# Patient Record
Sex: Female | Born: 1970 | Race: White | Hispanic: No | Marital: Married | Smoking: Current every day smoker
Health system: Southern US, Community
[De-identification: ages and names within clinical notes are randomized; demographics above are authoritative.]

## PROBLEM LIST (undated history)

## (undated) DIAGNOSIS — I10 Essential (primary) hypertension: Secondary | ICD-10-CM

## (undated) DIAGNOSIS — E119 Type 2 diabetes mellitus without complications: Secondary | ICD-10-CM

## (undated) HISTORY — PX: ABDOMINAL HYSTERECTOMY: SHX81

## (undated) HISTORY — PX: REPLACEMENT TOTAL KNEE BILATERAL: SUR1225

---

## 2003-12-10 ENCOUNTER — Ambulatory Visit (HOSPITAL_COMMUNITY): Admission: RE | Admit: 2003-12-10 | Discharge: 2003-12-10 | Payer: Self-pay | Admitting: Internal Medicine

## 2004-05-24 ENCOUNTER — Inpatient Hospital Stay (HOSPITAL_COMMUNITY): Admission: RE | Admit: 2004-05-24 | Discharge: 2004-05-28 | Payer: Self-pay | Admitting: Orthopedic Surgery

## 2005-07-04 ENCOUNTER — Ambulatory Visit (HOSPITAL_BASED_OUTPATIENT_CLINIC_OR_DEPARTMENT_OTHER): Admission: RE | Admit: 2005-07-04 | Discharge: 2005-07-04 | Payer: Self-pay | Admitting: Orthopedic Surgery

## 2005-11-04 ENCOUNTER — Encounter: Admission: RE | Admit: 2005-11-04 | Discharge: 2005-11-04 | Payer: Self-pay | Admitting: Orthopedic Surgery

## 2005-12-15 ENCOUNTER — Encounter (INDEPENDENT_AMBULATORY_CARE_PROVIDER_SITE_OTHER): Payer: Self-pay | Admitting: Specialist

## 2005-12-15 ENCOUNTER — Inpatient Hospital Stay (HOSPITAL_COMMUNITY): Admission: RE | Admit: 2005-12-15 | Discharge: 2005-12-19 | Payer: Self-pay | Admitting: Orthopedic Surgery

## 2006-02-14 ENCOUNTER — Encounter: Admission: RE | Admit: 2006-02-14 | Discharge: 2006-02-14 | Payer: Self-pay | Admitting: Orthopedic Surgery

## 2006-05-30 ENCOUNTER — Encounter: Admission: RE | Admit: 2006-05-30 | Discharge: 2006-05-30 | Payer: Self-pay | Admitting: Orthopedic Surgery

## 2006-06-08 ENCOUNTER — Encounter: Admission: RE | Admit: 2006-06-08 | Discharge: 2006-06-08 | Payer: Self-pay | Admitting: Orthopedic Surgery

## 2007-07-30 ENCOUNTER — Ambulatory Visit (HOSPITAL_BASED_OUTPATIENT_CLINIC_OR_DEPARTMENT_OTHER): Admission: RE | Admit: 2007-07-30 | Discharge: 2007-07-30 | Payer: Self-pay | Admitting: Anesthesiology

## 2007-08-11 ENCOUNTER — Ambulatory Visit: Payer: Self-pay | Admitting: Internal Medicine

## 2008-03-27 ENCOUNTER — Ambulatory Visit (HOSPITAL_COMMUNITY): Admission: RE | Admit: 2008-03-27 | Discharge: 2008-03-27 | Payer: Self-pay | Admitting: Orthopedic Surgery

## 2008-08-25 ENCOUNTER — Inpatient Hospital Stay (HOSPITAL_COMMUNITY): Admission: RE | Admit: 2008-08-25 | Discharge: 2008-08-28 | Payer: Self-pay | Admitting: Orthopedic Surgery

## 2010-04-04 ENCOUNTER — Encounter: Payer: Self-pay | Admitting: Internal Medicine

## 2010-04-04 ENCOUNTER — Encounter: Payer: Self-pay | Admitting: Orthopedic Surgery

## 2010-06-21 LAB — URINE MICROSCOPIC-ADD ON

## 2010-06-21 LAB — CBC
HCT: 25.5 % — ABNORMAL LOW (ref 36.0–46.0)
HCT: 37.5 % (ref 36.0–46.0)
MCHC: 34.3 g/dL (ref 30.0–36.0)
MCV: 88.9 fL (ref 78.0–100.0)
MCV: 88.6 fL (ref 78.0–100.0)
Platelets: 209 10*3/uL (ref 150–400)
Platelets: 260 10*3/uL (ref 150–400)
RBC: 2.87 MIL/uL — ABNORMAL LOW (ref 3.87–5.11)
RBC: 4.23 MIL/uL (ref 3.87–5.11)
RDW: 13.1 % (ref 11.5–15.5)
WBC: 8.4 10*3/uL (ref 4.0–10.5)
WBC: 5.6 10*3/uL (ref 4.0–10.5)
WBC: 9.3 10*3/uL (ref 4.0–10.5)

## 2010-06-21 LAB — URINALYSIS, ROUTINE W REFLEX MICROSCOPIC
Bilirubin Urine: NEGATIVE
Glucose, UA: NEGATIVE mg/dL
Hgb urine dipstick: NEGATIVE
Specific Gravity, Urine: 1.031 — ABNORMAL HIGH (ref 1.005–1.030)
pH: 6 (ref 5.0–8.0)

## 2010-06-21 LAB — DIFFERENTIAL
Basophils Relative: 0 % (ref 0–1)
Eosinophils Absolute: 0.2 10*3/uL (ref 0.0–0.7)
Eosinophils Relative: 3 % (ref 0–5)
Lymphs Abs: 2.3 10*3/uL (ref 0.7–4.0)
Monocytes Relative: 3 % (ref 3–12)
Neutrophils Relative %: 51 % (ref 43–77)

## 2010-06-21 LAB — BASIC METABOLIC PANEL
BUN: 4 mg/dL — ABNORMAL LOW (ref 6–23)
BUN: 7 mg/dL (ref 6–23)
BUN: 8 mg/dL (ref 6–23)
CO2: 31 mEq/L (ref 19–32)
CO2: 28 mEq/L (ref 19–32)
CO2: 33 mEq/L — ABNORMAL HIGH (ref 19–32)
Calcium: 8.4 mg/dL (ref 8.4–10.5)
Calcium: 8.3 mg/dL — ABNORMAL LOW (ref 8.4–10.5)
Chloride: 104 mEq/L (ref 96–112)
Creatinine, Ser: 0.64 mg/dL (ref 0.4–1.2)
Creatinine, Ser: 0.62 mg/dL (ref 0.4–1.2)
Creatinine, Ser: 0.64 mg/dL (ref 0.4–1.2)
GFR calc non Af Amer: >60 mL/min (ref >60)
Glucose, Bld: 129 mg/dL — ABNORMAL HIGH (ref 70–99)
Glucose, Bld: 132 mg/dL — ABNORMAL HIGH (ref 70–99)
Potassium: 4.6 mEq/L (ref 3.5–5.1)
Sodium: 136 mEq/L (ref 135–145)

## 2010-06-21 LAB — TYPE AND SCREEN
ABO/RH(D): O POS
Antibody Screen: NEGATIVE

## 2010-07-27 NOTE — Op Note (Signed)
NAME:  [name], [facility]            ACCOUNT NO.:  [id number]   MEDICAL RECORD NO.:  [id number]          PATIENT TYPE:  INP   LOCATION:  0009                         FACILITY:  [facility]   PHYSICIAN:  Madlyn Frankel. Charlann Boxer, M.D.  DATE OF BIRTH:  07-Apr-1970   DATE OF PROCEDURE:  08/25/2008  DATE OF DISCHARGE:                               OPERATIVE REPORT   PREOPERATIVE DIAGNOSIS:  Failed right total knee replacement.   POSTOPERATIVE DIAGNOSIS:  Failed right total knee replacement.   PROCEDURE:  Revision right total knee replacement utilizing DePuy  components, a size 3 posterior stabilized femoral component with a 4-mm  distal lateral augment with a standard stabilized box cut, a size 4 MBT  revision tray with 13-mm x 30-mm stem, 5-mm medial and lateral augments  and a 12.5 posterior stabilized insert with a 38 patella button.   SURGEON:  Madlyn Frankel. Charlann Boxer, MD.   ASSISTANT:  Dwyane Luo, PA.   ANESTHESIA:  General plus preoperative femoral nerve block.   SPECIMENS:  None.   FINDINGS:  Joint fluid was clear upon entry in the knee and the tibial  component was obviously loose with very little effort to remove despite  stem component.   BLOOD LOSS:  300 mL.   DRAINS:  Times 1.   TOURNIQUET TIME:  110 minutes at 300 mmHg.   COMPLICATIONS:  None.   INDICATIONS OF THE PROCEDURE:  Ms. [name] is a 40 year old female with  a history of index right total knee replacement.  She had stem  components placed initially.  She developed the onset of some leg pain  which was worked up for infection and found be negative.  A bone scan  indicated loosening of tibial component.  Given the pain and decreased  quality of life, she wished to proceed with a revision surgery despite  her age and discussions regarding this.  Consent was obtained for  revision right knee.   PROCEDURE IN DETAIL:  The patient was brought to the operative theater.  Once adequate anesthesia and preoperative antibiotics, 2 grams  of Ancef  administered the patient was positioned supine.  A right thigh  tourniquet was placed.  The leg was then pre-scrubbed, prepped and  draped in a sterile fashion.  A time-out was performed.  The right foot  was then placed into the [facility] leg holder.  The leg was exsanguinated,  tourniquet elevated to 300 mmHg.  The patient's old incision was  excised.  Sharp dissection was carried down to the extensor mechanism.  A median arthrotomy was carried out.  Time was spent at this point to  expose the suprapatellar pouch, medial and lateral gutters.  The patella  was able to be subluxated out of the way.  Following a proximal medial  peel off the proximal tibia, I was able to expose the proximal tibia and  debrided some of the anterior bone that had overgrown across this area  or was due to subsidence, the tibial component noted be loose.   I was able to free it up but had not yet fully exposed the  posterolateral corner.  I switched over and worked on the femur a bit, using osteotomes to free  up underneath the undersurface of the cement and component interface.  I  then used a Gigli saw in order to further free up some of the bone on  the anterior aspect of the femur.   Following this work on the femur, I attended back to the tibial  component, the tibia was now able to be subluxated and more anterior  with the femoral component in place.  I was able to move the tibial  component without difficulty.   Prior to removing any of the cement, the femoral component was removed  using osteotomes.  There was very little bone loss.  The ends of the  bone were debrided of any cement.   This point in time was spent removing cement from the femoral side as  well as the tibial side.  There was a non-cement bony type pedestal on  the distal portion of the tibia which was penetrated using an osteotome  and then opened up with a canal finder.   Femoral preparation was carried out first.  I  checked to make sure there  was no further cuts that were necessary on the distal cut, an  intramedullary rod was placed in 5 degrees valgus.  I ended up removing  very little, if any, bone on the distal medial side and very little to  any bone with a 4-mm cut on the lateral side.  For this reason, we chose  a 4-mm augment.   When I used the intramedullary guide and even with a +2 bolt it still  seemed anteriorize my femoral component significantly.  For this reason,  a lot of the cuts at this point were done utilizing a size 3 trial  component.  Once I identified that this was going to be sitting flush  anteriorly, I only removed a little bit of bone on the posteromedial  aspect the femur.   At this point we went ahead and did the box cut for the size 3  component, basing it on the lateral aspect of the distal femur, a box  cut was made for a standard posterior stabilized insert, chamfer cuts  made.  This also confirmed the posterior cut.   With the femoral component now finished, please note that the rotation  was based off the proximal cut of the tibia which was perpendicular as  checked with an extramedullary guide.  The extramedullary guide was  placed on the proximal tibia and a little bit of bone was removed on the  posterior aspect of the tibia.  I then chose to use a size 4 tibial  component which fit the cut surface best.   Time was spent at this point to confirm that I going to have a tibial  component with sitting in neutral within the canal on the medial and  lateral planes without any varus or valgus position.  A saw blade was  used to shave down the lateral side of the tibia a little bit in order  to assure that there was no varus positioning.   Eventually, I did a trial reduction with a 3 femur, 4 tibial tray with  MBT revision tray in place and a 15 insert, the knee came in a little  bit of hyperextension.  After this was done, I made a decision to go  ahead and  broach for a 29 cemented sleeve and used 5-mm medial and  lateral augments.   The final components were opened on the field, final tibial preparation  was carried out.   With trial components in place, I revised the patella as well, removing  the old patellar button which was an inset button.   I was able to debride the lateral facet changes.  I debrided and sawed  down to a size 14-mm and then used a 38 patellar button.  Drill holes  were made.  The patella tracked through the trochlea without application  of pressure.   At this point, the trial components were removed.  Final components were  opened on the back table.  The knee was irrigated with normal saline  solution, including using the canal brush on the tibia.  A cement  restrictor was placed distal on the tibia.  We mixed 3 bags of cement  with 1 gram of vancomycin and used a cement gun.  Once the cement ready,  the cement was cemented into the tibia.  The final tibial component,  which was a size 4 MBT revision tray with a 13 x 30 cemented sleeve, a  29-mm anterior placed sleeve and 5-mm medial and lateral augments was  impacted and excessive cement removed.  The femoral component was then  cemented in position with a distal 4-mm lateral augment.   The knee was then brought out to extension with a 12.5 insert, the knee  came to full extension, was held until cement fully cured.  Once the  cement had cured, excessive cement was removed.  The patella had been  cemented in position as well.  Based on examination, the ligaments were  very nice and taut from extension and flexion with a 12.5 insert, this  was chosen as the final insert.  The 12.5 posterior stabilized insert  was then placed.  We irrigated the knee throughout the case again at  this point, finishing up a 3 liter bag of normal saline solution.  A  medium Hemovac drain was placed, the tourniquet was let down after 110  minutes.  The extensor mechanism was then  reapproximated using #1 Vicryl  with the knee in flexion.  The remainder of the wound was closed with 2-  0 Vicryl running and staples on the skin.  Skin was cleaned, dried and  dressed sterilely with a sterile bulky dressing.  She was brought to the  recovery room in stable condition tolerating the procedure well.      Madlyn Frankel Charlann Boxer, M.D.  Electronically Signed     MDO/MEDQ  D:  08/25/2008  T:  08/25/2008  Job:  [id number]

## 2010-07-27 NOTE — Discharge Summary (Signed)
NAME:  [name], [facility]            ACCOUNT NO.:  [id number]   MEDICAL RECORD NO.:  [id number]          PATIENT TYPE:  INP   LOCATION:  1616                         [facility]ACILITY:  [facility]   PHYSICIAN:  Madlyn Frankel. Charlann Boxer, M.D.  DATE OF BIRTH:  [birth year]   DATE OF ADMISSION:  08/25/2008  DATE OF DISCHARGE:  08/28/2008                               DISCHARGE SUMMARY   ADMITTING DIAGNOSIS:  1. Failed right total knee replacement.  2. Osteoarthritis.  3. Hypertension.  4. Anxiety.  5. Hemorrhoid.  6. Depression.  7. Chronic pain.   DISCHARGE DIAGNOSIS:  1. Failed right total knee with subsequent revision right total knee.  2. Osteoarthritis.  3. Hypertension.  4. Anxiety.  5. Hemorrhoid.  6. Depression.  7. Chronic pain.   HISTORY OF PRESENT ILLNESS:  [name] is a 40 year old female had  bilateral total knee replacements.  Recent bone scan revealed loosening  of the right total knee replacement.  Due to persistent pain, she was  admitted for revision right total knee replacement by Dr. Charlann Boxer.   PROCEDURE:  Revision right total knee replacement by Dr. Durene Romans.  Assistant, Dwyane Luo, PA-C.   CONSULTATION:  None.   LABORATORY DATA:  CBC final reading white blood cell 8.4, hemoglobin  8.8, hematocrit 25.5 and platelets 209,000.  White cell differential  normal.  INR normal.  Metabolic panel sodium 139, potassium 3.6, BUN 4,  creatinine 0.64, glucose 129.  Calcium is 8.4.  UA showed trace of  ketones, moderate leukocyte esterase as well as calcium oxalate  crystals.   HOSPITAL COURSE:  The patient underwent revision right total knee and  was admitted to orthopedic floor.  Her pain was moderately difficult to  control as she is chronic pain management.  However, we were able to get  her comfortable.  She remained hemodynamically orthopedically stable.  Dressing was changed after day one with no significant drainage from  wound.  She had improving quad function.  She was  weightbearing as  tolerated and by day 3, met all discharge criteria for discharge home in  stable improved condition.   DISCHARGE DISPOSITION:  Discharged home in stable improved condition  with home health care PT.   DISCHARGE PHYSICAL THERAPY:  Weightbearing as tolerated with use rolling  walker.  Goals of physical therapy, 0-120 degrees by 6 weeks.   DISCHARGE DIET:  Heart healthy.   DISCHARGE WOUND CARE:  Keep dry.   DISCHARGE MEDICATIONS:  1. Aspirin 325 mg one p.o. b.i.d. times 6 weeks.  2. Robaxin 500 mg one p.o. q.6 muscle spasm pain.  3. OxyContin slow-release 80 mg 1 p.o. q. 12.  4. Oxycodone immediate release 30 mg one p.o. q.3 to 4 p.r.n. pain.  5. Iron 325 mg one p.o. t.i.d. x2 weeks.  6. Colace 100 mg one p.o. b.i.d.  7. MiraLax 70 grams one p.o. daily p.r.n.  8. Celebrex 2 mg one t.i.d. x2 weeks.  9. Ondansetron 4 mg p.o. q. 12 p.r.n.  10.Benazepril 40 mg p.o. daily.  11.Amlodipine 10 mg p.o. daily.  12.Triamterene hydrochlorothiazide 37.5/25 one p.o. daily.  13.Lasix 40 mg p.o.  daily.  14.Alprazolam 1 mg one p.o. q. 12.   DISCHARGE FOLLOWUP:  Follow with Dr. Charlann Boxer at phone number [phone] in 2  weeks for wound check.     ______________________________  Yetta Glassman. [address], [place]      Madlyn Frankel. Charlann Boxer, M.D.  Electronically Signed    BLM/MEDQ  D:  09/02/2008  T:  09/02/2008  Job:  [id number]   cc:   Fleet Contras, M.D.  Fax: [phone]   Dr. Valli Glance

## 2010-07-27 NOTE — Procedures (Signed)
NAME:  [name], [name]    ACCOUNT NO.:  [id number]   MEDICAL RECORD NO.:  [id number]          PATIENT TYPE:  OUT   LOCATION:  SLEEP CENTER                 FACILITY:  [facility]   PHYSICIAN:  Clinton D. Maple Hudson, MD, FCCP, FACPDATE OF BIRTH:  11-15-69   DATE OF STUDY:  07/30/2007                            NOCTURNAL POLYSOMNOGRAM   REFERRING PHYSICIAN:   REFERRING PHYSICIAN:  Moss Mc, MD   INDICATION FOR STUDY:  Hypersomnia with sleep apnea.   EPWORTH SLEEPINESS SCORE:  12/24.  BMI 49.7.  weight 327 pounds.  Height  68 inches.  Neck 14.5 inches.   MEDICATIONS:  Home medications are charted and reviewed.   SLEEP ARCHITECTURE:  Total sleep time 263.5 minutes with sleep  efficiency 73%.  Stage I was 13.3%, stage II 78.7%, stage III was  absent, REM 8% of total sleep time.  Sleep latency 45.5 minutes.  REM  latency 268.5 minutes.  Awake after sleep onset 52.5 minutes.  Arousal  index 14.6.  Darvocet was taken at 10:15 p.m.   RESPIRATORY DATA:  Apnea/hypopnea index (AHI) 2 per hour, RDI 4.6 per  hour, within normal limits.  A total of 9 events were scored, all  hypopneas.  Most events were while non-supine and in REM with REM AHI  17.1 per hour.  There were insufficient events to permit CPAP titration  by split protocol on this study night.   OXYGEN DATA:  Moderate snoring with oxygen desaturation to a nadir of  87%.  Mean oxygen saturation through the study was 95.2% on room air.   CARDIAC DATA:  Normal sinus rhythm.   MOVEMENT-PARASOMNIA:  No significant movement disturbance.  Bathroom x2.   IMPRESSIONS-RECOMMENDATIONS:  1. Occasional respiratory events, within normal limits, apnea/hypopnea      index 2 per hour (normal range 0-5 per hour).  Events were      primarily associated with non-supine sleep position while in REM.  2. Sleep was fragmented by frequent brief wakings.  Percentage time in      REM was diminished.  These are nonspecific in an unfamiliar      environment.      Clinton D. Maple Hudson, MD, [facility], FACP  Diplomate, Biomedical engineer of Sleep Medicine  Electronically Signed     CDY/MEDQ  D:  08/11/2007 11:03:11  T:  08/11/2007 11:16:37  Job:  [id number]

## 2010-07-27 NOTE — H&P (Signed)
NAME:  [name], [facility]            ACCOUNT NO.:  [id number]   MEDICAL RECORD NO.:  [id number]          PATIENT TYPE:  INP   LOCATION:                               FACILITY:  [facility]   PHYSICIAN:  Madlyn Frankel. Charlann Boxer, M.D.  DATE OF BIRTH:  November 27, 1970   DATE OF ADMISSION:  08/25/2008  DATE OF DISCHARGE:                              HISTORY & PHYSICAL   DIAGNOSIS:  Failed right total knee replacement.   PAST MEDICAL HISTORY:  Includes history of bilateral total knee  replacement, history of total hysterectomy and right ankle surgery.  She  has a history of hypertension, anxiety, hemorrhoids, depression and  chronic pain.   BRIEF HISTORY:  [name] is a 40 year old female with a history of index  bilateral total knee replacements.  These were performed by Dr. Fayrene Fearing  [place] with stemmed tibial components.  She had the new onset of knee  and leg pain.  Radiographs and workup had revealed concerns for a  loosening of her right total knee.   She wished to have something done due to the persistent pain and limited  function and quality of life and activity.   FAMILY HISTORY:  Noncontributory.   SOCIAL HISTORY:  She currently does not work and is on disability for  pain, anxiety and depression.   PRIMARY CARE PHYSICIAN:  Fleet Contras, M.D.   CURRENT MEDICATIONS:  1. Xanax 1 mg daily.  2. Zofran 4 mg q.12 h as needed for nausea.  3. Aspirin 81 mg daily.  4. Triamterene/hydrochlorothiazide daily.  5. Amlodipine 10 mg daily.  6. Benazepril daily.  7. Oxycodone 30 mg one every 4 hours.  8. OxyContin 80 mg every 12 hours.   DRUG ALLERGIES:  NAPROXEN.   REVIEW OF SYSTEMS:  Otherwise unremarkable for any upper respiratory,  gastrointestinal, genitourinary, cardiac or pulmonary issues over the  past 2-3 weeks other than that noted in the HPI.   PHYSICAL EXAMINATION:  VITAL SIGNS:  Examination finds that she had a  pulse of 80, respirations 12, blood pressure 128/82.  GENERAL:  She is  awake, alert and oriented with a friend in the office  today.  HEAD AND NECK:  Examination of her head and neck reveals no evidence of  any significant ocular or speech dysfunction.  She has normal cervical  range of motion.  CHEST:  Chest is clear to auscultation bilaterally.  HEART:  Heart had regular rate and rhythm.  ABDOMEN:  Soft, nontender.  She is overweight for her size.  EXTREMITIES:  Examination of the extremities reveals that she has a well-  healed surgical incision with pain to palpation about the right knee.  There were no signs of clinical concern for infection at this point  based on her exam findings.  Knee maneuvers appear to be stable, a  little bit of mobility with flexion and extension.  She has no evidence  of any cellulitic change.   Labs and EKG will be pending [facility] evaluation and anesthesia.  Her  x-rays indicate in our office a stemmed tibial component from [employer]  type complete with bone scan confirmation  of loosened tibial stem.   ASSESSMENT:  Failed right total knee replacement, loosened tibial  component.   PLAN:  Reviewed with [name] the current situation.  Based on  persistent increasing discomfort and workup which is concerning for  loosened tibial component she is going to be scheduled for a right total  knee revision.  We plan to revise her with cemented stem shorter than  what she had to try to decrease any potential postoperative tibial stem  pain.  Discussed the potential differences in cemented versus uncemented  status.   Postoperative pain management was discussed at length due to the fact  she has been very difficult to manager due to the amount of narcotics  she is already on.  The plan at this point is we will use OxyContin  baseline 80 mg p.o. q.12 h and do oxycodone 30 mg 1-2 tablets every 3  hours as needed for pain and breakthrough pain.  Dilaudid will be used  initially obviously if there are concerns about potential  apnea.   We will then plan to have her discharged to home with home physical  therapy, management of pain ourselves for the first couple of months and  then return her pain control measures over to Dr. Blenda Bridegroom in the  postoperative period.  Questions encouraged and reviewed today and  consent will be obtained after discussion.      Madlyn Frankel Charlann Boxer, M.D.  Electronically Signed     MDO/MEDQ  D:  08/24/2008  T:  08/24/2008  Job:  [id number]

## 2010-07-30 NOTE — Op Note (Signed)
NAME:  [name] [name]            ACCOUNT NO.:  [id number]   MEDICAL RECORD NO.:  [id number]          PATIENT TYPE:  AMB   LOCATION:  [facility]                         FACILITY:  [facility]   PHYSICIAN:  Marlowe Kays, M.D.  DATE OF BIRTH:  1970-05-25   DATE OF PROCEDURE:  07/04/2005  DATE OF DISCHARGE:                                 OPERATIVE REPORT   PREOPERATIVE DIAGNOSIS:  Symptomatic suprapatellar/medial shelf plica right  knee.   POSTOPERATIVE DIAGNOSES:  1.  Medial shelf suprapatellar plica.  2.  Torn medial meniscus.  3.  Osteoarthritis right knee.   OPERATION:  Right knee arthroscopy with:  1.  Excision of medial shelf plica.  2.  Partial medial meniscectomy.  3.  Debridement of patella and both femoral condyles.   SURGEON:  Marlowe Kays, M.D.   ASSISTANT:  Nurse.   ANESTHESIA:  General.   PATHOLOGY AND INDICATION FOR PROCEDURE:  She has had a total knee  replacement on the left.  She is morbidly obese.  Has popping in the knee,  which makes it difficult for her to ambulate initially.  MRI has  demonstrated as a dominant finding a large medial shelf/suprapatellar plica.  This was indeed found, but she also had grade 2-3/4 wear of the patella and  both femoral condyles, as well as a small tear of the posterior curve of the  medial meniscus.  She also had some mild wear of both medial and lateral  tibial plateaus.   PROCEDURE:  Prophylactic antibiotics because of total knee replacement on  the left.  Satisfactory general anesthesia.  Pneumatic tourniquet.  Right  leg was Esmarched out nonsterilely.  Thigh stabilizer applied and prepped  with DuraPrep from thigh stabilizer to the ankle and draped into a sterile  field.  Because of her size, I had to use an accessory support system for  the left leg, which was wrapped with an Ace wrap.  Superior and medial  saline inflow.  There was fluid in the knee.  First, in anterolateral  portal, medical compartment of the knee  joint was evaluated.  A small tear  of the posterior curve of the medial meniscus was appreciated and resected  with small baskets and shaved down until smooth with a 3.5 shaver.  Also  shaved the medial femoral condyle.  The medial tibial plateau did not  require any shaving.  Looking at the medial gutter and suprapatellar area,  the large plica was noted, cut with scissors, and resected with a 3.5  shaver.  I also shaved the patella after picturing the wear.  I then  reversed portals.  A small projection of soft tissue going up in the air  from the anterior portion of the lateral meniscus I pictured and resected.  She also had a 2-3/4 chondromalacia of the midportion of the lateral femoral  condyle which I shaved down until smooth as well.  I then looked up in the  lateral suprapatellar area, and did a little additional shaving of the  patella.  The knee joint was then irrigated until clear.  _____ removed.  The _____ portals were  closed with 4-0 nylon.  20 cc of 0.5% Marcaine with  adrenaline and 4 mg of morphine were then instilled through the _____  apparatus which was removed, and this portal was closed with 4-0 nylon as  well.  Tourniquet was released.  Betadine and Adaptic dry sterile dressing  were applied.  She tolerated the procedure well and was taken to the  recovery room in satisfactory condition, with no complications.           ______________________________  Marlowe Kays, M.D.     JA/MEDQ  D:  07/04/2005  T:  07/05/2005  Job:  [id number]

## 2010-07-30 NOTE — Op Note (Signed)
NAME:  [name], [facility]            ACCOUNT NO.:  [id number]   MEDICAL RECORD NO.:  [id number]          PATIENT TYPE:  INP   LOCATION:  0003                         FACILITY:  [facility]   PHYSICIAN:  Marlowe Kays, M.D.  DATE OF BIRTH:  1970/04/08   DATE OF PROCEDURE:  05/24/2004  DATE OF DISCHARGE:                                 OPERATIVE REPORT   PREOPERATIVE DIAGNOSIS:  Osteoarthritis, left knee.   POSTOPERATIVE DIAGNOSIS:  Osteoarthritis, left knee.   OPERATION:  Osteonics total knee replacement, left.   SURGEON:  Marlowe Kays, M.D.   ASSISTANT:  Georges Lynch. Darrelyn Hillock, M.D.   ANESTHESIA:  General.   INDICATIONS FOR PROCEDURE:  She is morbidly obese.  Had end-stage  osteoarthritis of her left knee with a varus deformity.  With these two  complications plus her age of 65, I elected to use an 80 mm extension to the  tibial component.   PROCEDURE:  Prophylactic antibiotics.  Satisfactory general anesthesia.  Foley catheter inserted.  Pneumatic tourniquet.  Shur-Foot.  Lateral hip  stabilizer.  The left leg was prepped with Duraprep from tourniquet to ankle  and draped in sterile field.  Ioban employed.  The leg was esmarched out  sterilely.  A vertical midline incision down to the patellar mechanism with  medial parapatellar incision to open the joint.  I freed up the patellar  mechanism, flexed the knee, and everted the patella.  The anterior portions  of both menisci and ACL and most of the PCL were then removed.  Also  undermined the pes anserinus and medial collateral ligament, working  posteriorly.  Osteophytes were removed from around the patella and the  femur.  The patella was sized to 26 mm.  I made a 5/16 inch drill hole in  the distal femur followed by the canal finder and the cutting jig for a 10  mm, 5 degree distal femoral cut.  After making the cut, we used the sizing  jig and sized her at a 9 femur.  Scribe lines were placed.  She had very  little room between  the femur and the tibia.  Consequently, I made my  leveling cut on the tibia first and then went back and used the cutting jig  to make anterior and posterior cuts and posterior and anterior chamferings.  Then returned to the tibia, and we sized it at a 7.  The base plate was  stabilized.  My initial intramedullary drill hole made followed by the step-  cut drill and canal finder.  We then used the external rod, splitting the  bimalleolar distance to mark the scribe line on the base plate.  After  stabilizing the plate, I made a 4 mm cut off the recess of the medial tibial  plateau initially.  We then placed laminar spreader.  I removed remnants of  the menisci and posterior portions of bone off the femoral condyles.  I then  placed a guide for creating the patellar groove, and the notchplasty.  After  all this was performed, I used the trial #9 femur and found that we need  more  bone resection off the tibia, with joint space being tight.  Accordingly, I placed the guide back and took an additional 4 mm of 0 cup.  We then went through a trial reduction and found the 10, possibly a 12  spacer would be adequate.  While the knee was in extension, we used the 10  mm recessed cutting jig to make a 26 mm recessed cut followed by making the  guide for making the three holes.  I then placed a trial patella and removed  any of the trial bone from around the patellar prosthesis.  I then returned  to the tibia where I placed the base plate, based on previous markings, and  we reamed for the tibial prong up to a 7-9 cemented using the tripod  apparatus.  We then prepared for the 8-mm extender, drilling to the  appropriate depth and to a 17, which gave a good bone contact.  We then  placed the base plate for this portion of the procedure, creating additional  deepening of the fins for the tibial component.  Then water-picked the knee  while the methacrylate was mixed, and the extender placed on the  tibial  component.  We then individually glued in the three components, starting  first with the tibia, impacting it tightly.  Did not put any methacrylate on  the extender but just on the proximal portion.  I then placed a 10-mm spacer  while we glued in the femur.  We packed it again, removing excess  methylmethacrylate and then held the knee in extension.  We used the  patellar holder to lock in the patella, trimming up excess  methylmethacrylate.  When the methacrylate had hardened, I removed small  amounts of methacrylate from around the components.  We checked posteriorly  to be sure there was no residual methacrylate.  The wound was irrigated well  with sterile saline.  I went through a final trial reduction, using this  time a 12-mm posterior stabilizer spacer, which seemed to be ideal.  There  was excellent flexion and extension with the prep slightly recurvatum on  extension and good medial lateral stability.  Based on this trial reduction,  went ahead and placed the final tibial spacer after irrigating the wound  well with sterile saline.  The Hemovac was placed, and the lateral release  was required.  The wound was then closed in layers with two layers of  interrupted #1 Vicryl in the quadriceps mechanism distally and in the  synovia and capsule, a combination of #1 and 2-0 Vicryl in the subcutaneous  tissues and staples in the skin.  Betadine Adaptic and dry sterile dressing  and a knee immobilizer were applied.  The tourniquet was released in two  hours and 9 minutes of tourniquet time, just before placing the skin  staples.  She tolerated the procedure well.  Following this dictation, was  on the way to the recovery room in satisfactory condition with no known  complications.  Blood loss was no more than 200 mL.  No blood replacement.      JA/MEDQ  D:  05/24/2004  T:  05/24/2004  Job:  [id number]

## 2010-07-30 NOTE — Discharge Summary (Signed)
NAME:  [name], [name]            ACCOUNT NO.:  [id number]   MEDICAL RECORD NO.:  [id number]          PATIENT TYPE:  INP   LOCATION:  0478                         FACILITY:  [facility]   PHYSICIAN:  Marlowe Kays, M.D.  DATE OF BIRTH:  [birth year]   DATE OF ADMISSION:  05/24/2004  DATE OF DISCHARGE:  05/28/2004                                 DISCHARGE SUMMARY   ADMISSION DIAGNOSES:  1.  Severe osteoarthritis of the left knee.  2.  Hypertension.  3.  Obesity.  4.  Mild asthma.   DISCHARGE DIAGNOSES:  1.  Severe osteoarthritis of the left knee.  2.  Hypertension.  3.  Obesity.  4.  Mild asthma.  5.  Mild postoperative anemia, treated with transfusion.   OPERATION:  On May 24, 2004 the patient underwent Osteonics total knee  replacement arthroplasty of the left knee.  Dr. Ranee Gosselin assisted.   BRIEF HISTORY AND PHYSICAL:  This is a 40 year old lady with continuing  aggressive problems concerning pain in both knees, more so on the left than  the right.  She has had an injury to that knee in 2001 playing basketball  and fell on the knees.  She has had knee arthroscopy in the past and  conservative treatment with anti-inflammatories and analgesics by Dr.  Concepcion Elk.  Unfortunately she continued with pain and discomfort.  X-rays show  bone on bone __________of the medial compartment and knee joints, advanced  osteoarthritis in the lateral compartment.  This is a very active lady who  unfortunately has found this to be markedly interfering with her daily  activities and after risks and benefits of surgery have been explained to  the patient, she agreed to go ahead with total knee replacement  arthroplasty.   HOSPITAL COURSE:  The patient tolerated the surgical procedure quite well,  was placed in total knee protocol.  The patient did well with her protocol,  advancing to ambulation in the room.  The wound remained dry without  evidence of infection.  Neurovascularly she was  intact with the left lower  extremity.  Cap was soft, no evidence of DVT.   Postoperatively the patient's hemoglobin dropped to 8.3 with hematocrit of  23.9.  I discussed with her transfusion.  It was okay with her and we  transfused her with two units of PRBCs.  This brought her hemoglobin up to  9.8.  She felt better and was doing well at the time of discharge.  We  felt once home arrangements were made with home health with [place].  It was  decided that she could be maintained in her home environment and so  arrangements were made for discharge.   LABORATORY DATA:  Hematologically showed a preoperative CBC which was  completely within normal limits.  Hemoglobin was 13.6, hematocrit was 39.8.  Hemoglobin was 9.8, hematocrit was 28.3.  Blood chemistries were essentially  normal.  She did have a mild drop in potassium postoperatively at 3.4.  Urinalysis was negative for urinary tract infection.  Blood type was O  positive.   Electrocardiogram showed sinus bradycardia.  Chest x-ray showed no  active  chest disease.   CONDITION ON DISCHARGE:  Improved.   PLAN:  The patient was discharged to her home to continue weight bearing as  tolerated.   DISCHARGE MEDICATIONS:  1.  Tylox for discomfort.  2.  Robaxin as a muscle relaxant.  3.  Continue with Trinsicon.   FOLLOW UP:  Return to see Dr. Simonne Come in two weeks after date of surgery.  Continue with home medications and diet.  Follow with Dr. Concepcion Elk as needed.      DLU/MEDQ  D:  06/10/2004  T:  06/10/2004  Job:  [id number]   cc:   Marlowe Kays, M.D.  [address]  [place]  [place] [address]  Fax: [phone]   Fleet Contras, M.D.  [address]  [place]  [place] [address]  Fax: [phone]

## 2010-07-30 NOTE — H&P (Signed)
NAME:  [name], [name]            ACCOUNT NO.:  [id number]   MEDICAL RECORD NO.:  [id number]          PATIENT TYPE:  INP   LOCATION:  NA                           FACILITY:  [facility]   PHYSICIAN:  Marlowe Kays, M.D.  DATE OF BIRTH:  1970-10-30   DATE OF ADMISSION:  DATE OF DISCHARGE:                                HISTORY & PHYSICAL   CHIEF COMPLAINT:  Pain in my left knee.   HISTORY OF PRESENT ILLNESS:  This 40 year old female seen by Dr. Simonne Come  for continued progressive problems concerning pain in both knees.  She has  more pain and discomfort in the left knee than the right.  She had an injury  which occurred back in 2001 when she was playing basketball and fell on the  knee.  She had knee arthroscopy done several years later at [facility], but unfortunately, she has continued with pain and discomfort.  Dr. Concepcion Elk has been treating her conservatively with anti-inflammatories  and analgesics.  Unfortunately, she has continued with pain and discomfort.  Examination reveals tenderness over the medial joint line of the left knee.  Neurologically, she is intact in the lower extremity.  Left knee x-rays have  shown bone-on-bone abutment in the medial compartment of the knee joint with  advanced osteoarthritis in the lateral compartment.  Due to these positive  findings in this relatively young lady who is extremely active and is  finding it to be more and more difficult getting about plus the fact that  she has put on a considerable amount of weight.  After discussion of the  pros and cons and the risks and benefits of surgery, it was decided she  would benefit from a total knee replacement arthroplasty, and she is being  admitted for Osteonics total knee replacement arthroplasty of the left knee.  Should she have any medical problems, we will certainly ask Dr. Concepcion Elk or  one of his associates to follow along with [place] during this patient's  hospitalization.   PAST MEDICAL  HISTORY:  Other than the present illness, the patient had been  in relatively good health.  She is being treated for hypertension and some  anxiety/depression.   ALLERGIES:  NAPROXEN WHICH CAUSES VOMITING AND NAUSEA.   CURRENT MEDICATIONS:  1.  Lotrel 10/20 one daily.  2.  Lexapro 20 mg one daily.  3.  Xanax 25 mg one daily.  4.  Trazodone one q.h.s.  5.  Darvocet for pain.  6.  She has an albuterol inhaler.  She uses two puffs b.i.d. p.r.n.   PAST SURGICAL HISTORY:  1.  She has had some sort of a stomach surgery back in 2004 and 2000.  2.  Right ankle surgery in 1990.  3.  Left knee scope, as mentioned above.   FAMILY HISTORY:  Positive for hypertension in the mother and diabetes as  well.   SOCIAL HISTORY:  The patient is unmarried, has a fiance.  Not working at  this time.  Has no intake of alcohol or tobacco products.  She has two  children, and her fiance will care for her  after surgery.   REVIEW OF SYSTEMS:  CNS:  No seizures, paralysis, double vision.  RESPIRATORY:       No productive cough, no hemoptysis, no shortness of  breath.  She does well as long as she uses her inhaler.  CARDIOVASCULAR:  No  chest pain, no angina, or orthopnea, but she says she has a heart murmur.  GASTROINTESTINAL:  No nausea, vomiting, melena, or bloody stool, but she has  little appetite.  GENITOURINARY:  No discharge, dysuria, hematuria.  MUSCULOSKELETAL:  Primarily in present illness with her knees.   PHYSICAL EXAMINATION:  GENERAL:  Alert, cooperative, friendly, obese 33-year-  old black female.  VITAL SIGNS:  Blood pressure 130/86, pulse 72, respirations 12.  HEENT:  Normocephalic.  PERRLA.  EOM intact.  Oropharynx clear.  CHEST:  Clear to auscultation.  No rhonchi or rales.  HEART:  Regular rate and rhythm.  No murmurs are heard.  ABDOMEN:  Obese, soft.  Liver and spleen not felt.  GENITALIA, RECTAL, PELVIC, BREASTS:  Not done, not pertinent to present  illness.  EXTREMITIES:  Left  knee as in present illness above.   ADMISSION DIAGNOSES:  1.  Severe osteoarthritis of the left knee.  2.  Hypertension.  3.  Obesity.  4.  Mild asthma.   PLAN:  The patient will be admitted for total knee replacement arthroplasty  of the left knee.  We plan for her to have home health after her regular  hospitalization, utilizing the services of [place].      DLU/MEDQ  D:  05/24/2004  T:  05/24/2004  Job:  [id number]   cc:   Fleet Contras, M.D.  [address]  [place]  [place] [address]  Fax: [phone]   Marlowe Kays, M.D.  [address]  [place]  [place] [address]  Fax: [phone]

## 2010-07-30 NOTE — Op Note (Signed)
NAME:  [name], [name]            ACCOUNT NO.:  [id number]   MEDICAL RECORD NO.:  [id number]          PATIENT TYPE:  INP   LOCATION:  1506                         FACILITY:  [facility]   PHYSICIAN:  Marlowe Kays, M.D.  DATE OF BIRTH:  May 08, 1970   DATE OF PROCEDURE:  DATE OF DISCHARGE:                                 OPERATIVE REPORT   PREOPERATIVE DIAGNOSIS:  Osteoarthritis, right knee.   POSTOPERATIVE DIAGNOSIS:  Osteoarthritis, right knee.   OPERATION:  Osteonics total knee replacement, right.   SURGEON:  Marlowe Kays, M.D.   ASSISTANT:  Mr. Adrian Blackwater.   ANESTHESIA:  General.   PATHOLOGY AND JUSTIFICATION FOR PROCEDURE:  Despite her young age, I  performed a total knee replacement on the left in March 2006 and this has  done extremely well.  In the right knee, she has had arthroscopic procedure  but had a recurrent tear of the medial meniscus and enough pain that she did  not feel that she wished to have an arthroscopic procedure but instead a  total knee replacement since the left knee was doing so well.  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 This  was abnormal enough in appearance that we sent it to pathology for frozen  section which demonstrated no malignancy.  She also had an encased cyst in  this fibrotic synovium and superior lateral suprapatellar area.   PROCEDURE:  Prophylactic antibiotics, satisfactory general anesthesia, Foley  catheter inserted, pneumatic tourniquet Sure Foot and lateral hip  stabilizer. Right leg was prepped with DuraPrep from tourniquet to ankle and  draped in sterile field.  Ioban employed.  Vertical midline incision down to  the patellar mechanism.  After Esmarching  the leg out sterilely, tourniquet  was inflated 375 mmHg.  Median parapatellar incision opened the joint,  patellar mechanism was freed up but because of the size I could not evert it  and simply moved it to the lateral side of the lateral femoral condyle.  The  anterior portions of the menisci and ACL were resected to expose the distal  femur.  I made a [name]/16 inch drill hole in the distal femur followed by canal  finder and the axis aligner set for [name] degrees valgus cut to the right knee.  Preoperatively she had some slight hyperextension so that I took only the 10  mm off the distal femur.  In order to get the jig for sizing the distal  femur, I had to remove some bone from proximal tibia first which gave me  more working room.  She was sized at a #9 which was what we had utilized in  her opposite knee.  The scribe lines were placed.  I then placed the jig for  making the anterior and posterior cuts and posterior anterior chamferings  which were made.  I then returned to the tibia  and we sized it and  tentatively at a 7 and placed the base plate and made my initial  intramedullary drill hole followed by the step-cut drill, canal finder and  the intramedullary rod, the external cutting guide set 4 mm cut off the  medial tibial plateau.  This 90 degrees cut was made.  I then returned to  the femur and placed laminar spreader removing bone and soft tissue  posteriorly.  I then applied the jig for making the patellar groove cuff and  for the notchplasty.  All this was performed.  I then went through a trial  reduction, found that a 12-mm spacer seemed to be the ideal size which again  was what we had used in the left knee.  Using the external rod I lined the  base plate splitting bimalleolar distance with the rod.  While the knee was  in extension, we then used the 26 mm 10 mm recess cutting jig with 26 mm  being what we used in the left knee.  The 10-mm recessed cut was made,  followed by the  guide for making three fixation holes.  I then placed the  trial prosthesis and trimmed away bone from around the perimeter.  We then  returned to the tibia and using the base plate, used tripod apparatus to  ream for the tibial keel up to a 79 cemented.  We then used the rasp/reamer  to go up to 17 x 80 mm distance for the tibial extension which had worked  very well in her left knee.  She was a large person and varus type knee.  I  went through a trial reduction, found that the tibial component with the  extension fit snugly on the tibia.  At this point we released the tourniquet  with an hour and 25 minutes of tourniquet time because we were getting a lot  of venous back bleed.  We used the pulsatile irrigation to cleanse the wound  while the components were being put together methyl methacrylate mixed. I  then individually glued in the components starting first with the tibia  impacting it and placing methyl methacrylate only on the normal portion of  the tibial component and not on the extension.  I trimmed up excess  methacrylate then moved the femur and finally the patella which I held with  a patellar holding clamp.  Once the methacrylate had hardened, we checked  for particles which were removed as appropriate and then went through  another trial reduction with a 12-mm spacer and found this to be the ideal  size with good medial and lateral stability and minimal recurvatum.  The  patella was stable and did not need a lateral release.  Accordingly, after  irrigating the wound well and replaced the final 12 mm posterior stabilized  spacer, I checked motion and stability, placed Hemovac and then closed the  wound in layers with interrupted #1 Vicryl in two layers in the quadriceps  mechanism, and in the synovium capsule distally, 2-0 Vicryl subcutaneous  tissue, staples in the skin.  Betadine Adaptic dry sterile dressing and a knee immobilizer applied.  She tolerated the procedure well  and was taken to  the recovery room in satisfactory with no known complications.  Estimated  blood loss was perhaps 400 mL.  No blood replacement.           ______________________________  Marlowe Kays, M.D.     JA/MEDQ  D:  12/15/2005  T:  12/17/2005  Job:  [id number]

## 2010-07-30 NOTE — Discharge Summary (Signed)
NAME:  [name], [name]            ACCOUNT NO.:  [id number]   MEDICAL RECORD NO.:  [id number]          PATIENT TYPE:  INP   LOCATION:  1506                         FACILITY:  [facility]   PHYSICIAN:  Marlowe Kays, M.D.  DATE OF BIRTH:  1970/08/05   DATE OF ADMISSION:  12/15/2005  DATE OF DISCHARGE:  12/19/2005                                 DISCHARGE SUMMARY   ADMITTING DIAGNOSES:  1. Severe osteoarthritis of the right knee.  2. Hypertension.  3. Obesity.  4. Remote history of migraines.   DISCHARGE DIAGNOSIS:  1. Severe osteoarthritis of the right knee.  2. Hypertension.  3. Obesity.  4. Remote history of migraines.  5. Postoperative acute blood loss anemia treated with transfusion (with      the patient permission).   OPERATION:  On December 15, 2005, the patient underwent [employer] total knee  replacement arthroplasty on the right knee.  Jenne Campus, PA-C,  assisted.   BRIEF HISTORY:  This 40 year old female who successfully underwent a total  knee replacement arthroplasty to her left knee about a year ago has had  progressive problems concerning pain and dysfunction to the right knee.  She  was a very active lady who has tried to lose weight with some exercises;  however, the right knee prohibit this.  After much discussion including the  risks and benefits of surgery, it was decided she would benefit with  surgical intervention and was admitted for total knee replacement  arthroplasty of the right knee.   COURSE IN [name]:  The patient tolerated the surgical procedure quite  well.  She was familiar with the total knee protocol and entered into  physical therapy with gusto.  Unfortunately, we had a bit of a problem with  her pain control.  We tried Dilaudid PCA high-dose which reduced some of her  discomfort.  We weaned her to p.o. medications.  She had used Percocet  postoperatively for her other knee year ago, however, that was not  controlling her discomfort  and we had to supplement with Dilaudid.  At the  time of discharge, we decided that Dilaudid and Percocet taken at the same  time would be too much of a risk, so we decided on Dilaudid.   At discharge, the patient was walking 120 feet in the hallway with physical  therapy and walker, getting out of bed by herself, going to the bathroom.  She is awake, alert and oriented.  The neurovascular was grossly intact to  the right lower extremity and wound was dry and staples were intact.   We discussed with Dr. Simonne Come as well as myself her home program.  Once  all was in place, her husband could pick her up on the evening of the day of  discharge and arrangements were made for discharge.   She was noted to have postoperative acute blood loss anemia.  She was  transfused by Mr. Julien Girt and Dr. Lequita Halt.  This brought her hemoglobin up  to 8.29 from 7.8.  We will continue her on iron supplements at home for one  month.   Dr. Simonne Come  also discussed with her the need for an extreme endeavor in  weight loss.  There is of course always a risk of loosening with her extreme  weight.  She will take this into consideration and has told us she had plans  for weight reduction program.   It was noted that she had a molar extraction prior to surgery.  She was  covered with IV antibiotics intraoperatively and postoperatively.  We will  send her home with Keflex one b.i.d. x7 more days.   LABORATORY VALUES:  In [name] hematologically showed a preoperative  CBC completely within normal limits.  The hemoglobin was 12.9, hematocrit  36.9.  Final hemoglobin was 8.9 with hematocrit 25.5, white count was 5.9.  Blood chemistries remained normal throughout her hospitalization.  Urinalysis was negative for urinary tract infection.  The pathology reports  from the tissue sent for evaluation, the synovium, was not available at this  dictation either in chart nor in computer.  No chest x-ray was seen and   electrocardiogram showed normal sinus rhythm.   CONDITION ON DISCHARGE:  Improved, stable.   PLAN:  The patient is discharged to her home.  Continue with weightbearing  as tolerated.  Continue with home health.  Continue with Lovenox which was  given postoperatively for a total of 10 days from surgery, then go to one  enteric-coated aspirin a day.  She is to resume her home medications and  diet.   MEDICATIONS AT DISCHARGE:  1. Dilaudid 2 mg tabs 1-2 q.4-6h. p.r.n. pain, #50 are written.  2. Trinsicon #60 b.i.d. x1 month.  3. Celebrex 200 mg.  She will take 2 this morning while in [name]      and she will take 2 a day for 4 days and then 1 a day.  4. Robaxin 500 mg 1 q.6h. p.r.n. muscle spasm.  A prescription for 30 with      3 refills was given.   Use dry dressing to the knee.  Continue with ice.  She is encouraged to call  if she has any problems and return to see Dr. Simonne Come in approximately 2  weeks after the date of surgery.      Dooley L. Cherlynn June.    ______________________________  Marlowe Kays, M.D.    DLU/MEDQ  D:  12/19/2005  T:  12/19/2005  Job:  [id number]   cc:   Fleet Contras, M.D.  Fax: [phone]

## 2010-07-30 NOTE — H&P (Signed)
NAME:  [name], [facility]            ACCOUNT NO.:  [id number]   MEDICAL RECORD NO.:  [id number]          PATIENT TYPE:  INP   LOCATION:  NA                           FACILITY:  [facility]   PHYSICIAN:  Marlowe Kays, M.D.  DATE OF BIRTH:  07/13/1970   DATE OF ADMISSION:  05/24/2004  DATE OF DISCHARGE:                                HISTORY & PHYSICAL   ADDENDUM:  The history and physical performed on Naveah Dorris Carnes was  performed in our office on May 21, 2004.      ________________________________________  Druscilla Brownie. Cherlynn June  ___________________________________________James Simonne Come, M.D.    DLU/MEDQ  D:  05/24/2004  T:  05/24/2004  Job:  [id number]

## 2010-07-30 NOTE — H&P (Signed)
NAME:  [name], [name]            ACCOUNT NO.:  [id number]   MEDICAL RECORD NO.:  [id number]          PATIENT TYPE:  INP   LOCATION:  NA                           FACILITY:  [facility]   PHYSICIAN:  Marlowe Kays, M.D.  DATE OF BIRTH:  1970/08/17   DATE OF ADMISSION:  12/15/2005  DATE OF DISCHARGE:                                HISTORY & PHYSICAL   CHIEF COMPLAINT:  Pain in my right knee.   PRESENT ILLNESS:  A 40 year old lady who has had successful total knee  replacement arthroplasty to the left knee in 2006, has had continuing  problems concerning her right knee.  X-rays and  MRI showed degenerative  changes to the right knee.  She developed a rather severe valgus deformity.  There was a plan for her to have knee arthroscopy, but due to deterioration  of the knee, it is felt she would benefit from surgical intervention with a  total knee replacement arthroplasty.  She actually asked for this procedure.  After risks and benefits of surgery were explained to her, we decided to go  ahead with the total knee replacement arthroplasty of the right.   She has been seen by Dr. Fleet Contras, and he is aware of the surgery.  During her last post-hospital rehabilitation.  She had home health with  Genevieve Norlander and then went into a rehabilitation program on an outpatient basis.   PAST MEDICAL HISTORY:  SHE IS ALLERGIC TO NAPROSYN.   CURRENT MEDICATIONS:  1. Lorel 10/28 daily.  2. Seroquel 400 mg daily.  3. Lexapro 10 mg daily.  4. Xanax 10 mg daily.  5. Celebrex 10 mg daily.  6. Lasix 20 mg daily.  7. Hydrocodone for pain.  8. Methocarbamol.  (Today, she was written a prescription for Percocet to      use preoperatively).   LAST SURGERIES:  Laceration cleared include ORIF, right ankle 1998, stomach  surgery in 2000 and 2002, in 2006, left total knee replacement arthroplasty.  Medically, she is being treated for anxiety, as well as hypertension.  She  notes here that she has a heart  murmur.   FAMILY HISTORY:  Positive for diabetes and arthritis.   SOCIAL HISTORY:  The patient is married.  She is not employed, no intake of  alcohol or tobacco products, has two children.  Genevieve Norlander and family will be  caregivers after surgery.  She lives in a one level home.   REVIEW OF SYSTEMS:  CNS: No seizures, shoulder paralysis, numbness, double  vision.  RESPIRATORY:  No productive cough, no hemoptysis or shortness of  breath.  CARDIOVASCULAR: No chest pain, no angina, no orthopnea.  GASTROINTESTINAL:  No nausea, vomiting, melena or bloody stool.  GENITOURINARY:  No discharge, dysuria, hematuria.   PHYSICAL EXAMINATION:  GENERAL:  Alert, cooperative, friendly 40 year old  black female, is quite obese and she is accompanied by her son.  She has a  TENS unit attached to her right knee.  VITAL SIGNS:  Blood pressure 124/82, pulse 76, respirations 12.  HEENT: Normocephalic PERRLA intact.  Oropharynx is clear.  CHEST:  Clear to  auscultation, no  rhonchi, no rales.  HEART: With regular rate and rhythm.  Sounds are somewhat distant, but there  is a grade 3/6 holosystolic murmur heard left in the left sternal border.  ABDOMEN:  Obese, soft, liver and spleen not felt.  GENITALIA, PELVIC, BREASTS:  Not done, not pertinent to present illness.  EXTREMITIES:  The patient has a valgus deformity of right knee.  She has  painful range of motion with some crepitus.  Again, TENS unit is attached to  the knee with four electrodes.   ADMITTING DIAGNOSIS:  1. Osteoarthritis of the right knee.  2. Hypertension.  3. Anxiety.   PLAN:  The patient to undergo right total knee replacement arthroplasty with  home health with [place].  Should we ever have any medical problems, will  certainly contact Dr. Concepcion Elk for any medical problems.      Dooley L. Cherlynn June.    ______________________________  Marlowe Kays, M.D.    DLU/MEDQ  D:  12/07/2005  T:  12/08/2005  Job:  [id number]   cc:    Fleet Contras, M.D.  Fax: [phone]

## 2013-07-03 ENCOUNTER — Other Ambulatory Visit (HOSPITAL_COMMUNITY): Payer: Self-pay | Admitting: Orthopedic Surgery

## 2013-07-03 DIAGNOSIS — M25562 Pain in left knee: Secondary | ICD-10-CM

## 2013-07-11 ENCOUNTER — Encounter (HOSPITAL_COMMUNITY)
Admission: RE | Admit: 2013-07-11 | Discharge: 2013-07-11 | Disposition: A | Discharge status: Home / Self Care | Payer: 59 | Source: Ambulatory Visit | Attending: Orthopedic Surgery | Admitting: Orthopedic Surgery

## 2013-07-11 ENCOUNTER — Ambulatory Visit (HOSPITAL_COMMUNITY)
Admission: RE | Admit: 2013-07-11 | Discharge: 2013-07-11 | Disposition: A | Discharge status: Home / Self Care | Payer: 59 | Source: Ambulatory Visit | Attending: Orthopedic Surgery | Admitting: Orthopedic Surgery

## 2013-07-11 DIAGNOSIS — M25562 Pain in left knee: Secondary | ICD-10-CM

## 2013-07-11 DIAGNOSIS — M25569 Pain in unspecified knee: Secondary | ICD-10-CM | POA: Insufficient documentation

## 2013-07-11 MED ORDER — TECHNETIUM TC 99M MEDRONATE IV KIT
25.0000 | PACK | Freq: Once | INTRAVENOUS | Status: AC | PRN
  Administered 2013-07-11: 25 via INTRAVENOUS

## 2017-11-25 ENCOUNTER — Emergency Department (HOSPITAL_BASED_OUTPATIENT_CLINIC_OR_DEPARTMENT_OTHER): Payer: 59

## 2017-11-25 ENCOUNTER — Other Ambulatory Visit: Payer: Self-pay

## 2017-11-25 ENCOUNTER — Emergency Department (HOSPITAL_BASED_OUTPATIENT_CLINIC_OR_DEPARTMENT_OTHER)
Admission: EM | Admit: 2017-11-25 | Discharge: 2017-11-25 | Disposition: A | Discharge status: Home / Self Care | Payer: 59 | Attending: Emergency Medicine | Admitting: Emergency Medicine

## 2017-11-25 ENCOUNTER — Encounter (HOSPITAL_BASED_OUTPATIENT_CLINIC_OR_DEPARTMENT_OTHER): Payer: Self-pay | Admitting: Emergency Medicine

## 2017-11-25 DIAGNOSIS — Z96653 Presence of artificial knee joint, bilateral: Secondary | ICD-10-CM | POA: Diagnosis not present

## 2017-11-25 DIAGNOSIS — J40 Bronchitis, not specified as acute or chronic: Secondary | ICD-10-CM

## 2017-11-25 DIAGNOSIS — I10 Essential (primary) hypertension: Secondary | ICD-10-CM | POA: Diagnosis not present

## 2017-11-25 DIAGNOSIS — R05 Cough: Secondary | ICD-10-CM | POA: Diagnosis present

## 2017-11-25 DIAGNOSIS — Z79899 Other long term (current) drug therapy: Secondary | ICD-10-CM | POA: Insufficient documentation

## 2017-11-25 DIAGNOSIS — J069 Acute upper respiratory infection, unspecified: Secondary | ICD-10-CM

## 2017-11-25 DIAGNOSIS — R110 Nausea: Secondary | ICD-10-CM | POA: Insufficient documentation

## 2017-11-25 DIAGNOSIS — B9789 Other viral agents as the cause of diseases classified elsewhere: Secondary | ICD-10-CM

## 2017-11-25 HISTORY — DX: Essential (primary) hypertension: I10

## 2017-11-25 LAB — BASIC METABOLIC PANEL
Anion gap: 9 (ref 5–15)
BUN: 10 mg/dL (ref 6–20)
CALCIUM: 9.4 mg/dL (ref 8.9–10.3)
CO2: 29 mmol/L (ref 22–32)
CREATININE: 0.66 mg/dL (ref 0.44–1.00)
Chloride: 101 mmol/L (ref 98–111)
GFR calc Af Amer: >60 mL/min (ref >60)
Glucose, Bld: 150 mg/dL — ABNORMAL HIGH (ref 70–99)
Potassium: 3.6 mmol/L (ref 3.5–5.1)
SODIUM: 139 mmol/L (ref 135–145)

## 2017-11-25 LAB — CBC
HCT: 45.3 % (ref 36.0–46.0)
HEMOGLOBIN: 15.9 g/dL — AB (ref 12.0–15.0)
MCH: 31.9 pg (ref 26.0–34.0)
MCHC: 35.1 g/dL (ref 30.0–36.0)
MCV: 90.8 fL (ref 78.0–100.0)
PLATELETS: 294 10*3/uL (ref 150–400)
RBC: 4.99 MIL/uL (ref 3.87–5.11)
RDW: 12.5 % (ref 11.5–15.5)
WBC: 6.8 10*3/uL (ref 4.0–10.5)

## 2017-11-25 MED ORDER — DM-GUAIFENESIN ER 30-600 MG PO TB12: 1.0000 | ORAL_TABLET | Freq: Two times a day (BID) | ORAL | 0 refills | Status: DC

## 2017-11-25 MED ORDER — IBUPROFEN 800 MG PO TABS: 800.0000 mg | ORAL_TABLET | Freq: Three times a day (TID) | ORAL | 0 refills | Status: AC

## 2017-11-25 MED ORDER — PROMETHAZINE HCL 25 MG PO TABS: 25.0000 mg | ORAL_TABLET | Freq: Four times a day (QID) | ORAL | 0 refills | Status: DC | PRN

## 2017-11-25 MED ORDER — HYDROCHLOROTHIAZIDE 25 MG PO TABS: 25.0000 mg | ORAL_TABLET | Freq: Every day | ORAL | 2 refills | Status: AC

## 2017-11-25 MED ORDER — ONDANSETRON 4 MG PO TBDP
4.0000 mg | ORAL_TABLET | Freq: Once | ORAL | Status: AC
  Administered 2017-11-25: 4 mg via ORAL
  Filled 2017-11-25: qty 1

## 2017-11-25 NOTE — ED Notes (Signed)
Provider aware of patients elevated blood pressure.

## 2017-11-25 NOTE — ED Triage Notes (Signed)
Pt states that she has been sick for the past three days. She has been having a headache, cough with green mucous, and nasal congestion. Pt states that she feels nauseaed and throws up when she coughs. Pt states she can only keep soup down.

## 2017-11-25 NOTE — ED Provider Notes (Signed)
[facility] EMERGENCY DEPARTMENT Provider Note   CSN: [id number] Arrival date & time: 11/25/17  1601     History   Chief Complaint Chief Complaint  Patient presents with  . Cough  . Nasal Congestion    HPI [name] is a 47 y.o. female.  Patient with a complaint of cough congestion productive cough green sputum for the past 3 days.  Associated with a headache some posttussive emesis.  Nauseated feeling.  Patient is able to keep liquids down.  No diarrhea.  No sick contacts.  Patient states that she has been using over-the-counter TheraFlu and Motrin.     Past Medical History:  Diagnosis Date  . Hypertension     There are no active problems to display for this patient.   Past Surgical History:  Procedure Laterality Date  . REPLACEMENT TOTAL KNEE BILATERAL       OB History   None      Home Medications    Prior to Admission medications   Medication Sig Start Date End Date Taking? Authorizing Provider  dextromethorphan-guaiFENesin (MUCINEX DM) 30-600 MG 12hr tablet Take 1 tablet by mouth 2 (two) times daily. 11/25/17   Vanetta Mulders, MD  hydrochlorothiazide (HYDRODIURIL) 25 MG tablet Take 1 tablet (25 mg total) by mouth daily. 11/25/17   Vanetta Mulders, MD  ibuprofen (ADVIL,MOTRIN) 800 MG tablet Take 1 tablet (800 mg total) by mouth 3 (three) times daily. 11/25/17   Vanetta Mulders, MD  promethazine (PHENERGAN) 25 MG tablet Take 1 tablet (25 mg total) by mouth every 6 (six) hours as needed for nausea or vomiting. 11/25/17   Vanetta Mulders, MD    Family History No family history on file.  Social History Social History   Tobacco Use  . Smoking status: Not on file  Substance Use Topics  . Alcohol use: Not on file  . Drug use: Not on file     Allergies   Naproxen   Review of Systems Review of Systems  Constitutional: Negative for fever.  HENT: Positive for congestion. Negative for sore throat.   Eyes: Negative for redness.    Respiratory: Positive for cough.   Gastrointestinal: Positive for nausea. Negative for abdominal pain and diarrhea.  Genitourinary: Negative for dysuria.  Musculoskeletal: Positive for myalgias.  Skin: Negative for rash.  Neurological: Positive for headaches.  Hematological: Does not bruise/bleed easily.  Psychiatric/Behavioral: Negative for confusion.     Physical Exam Updated Vital Signs BP (!) 240/140   Pulse 86   Temp 98.6 F (37 C) (Oral)   Resp 20   Ht 1.727 m (5\' 8" )   Wt (!) 137 kg   SpO2 97%   BMI 45.92 kg/m   Physical Exam  Constitutional: She is oriented to person, place, and time. She appears well-developed and well-nourished. No distress.  HENT:  Head: Normocephalic and atraumatic.  Mouth/Throat: Oropharynx is clear and moist.  Uvula midline no significant erythema to the posterior pharynx no exudate.  Eyes: Pupils are equal, round, and reactive to light. Conjunctivae and EOM are normal.  Neck: Neck supple.  Cardiovascular: Normal rate, regular rhythm and normal heart sounds.  Pulmonary/Chest: Effort normal and breath sounds normal. No respiratory distress.  Abdominal: Soft. Bowel sounds are normal. There is no tenderness.  Musculoskeletal: Normal range of motion.  Neurological: She is alert and oriented to person, place, and time. No cranial nerve deficit or sensory deficit. She exhibits normal muscle tone. Coordination normal.  Skin: Skin is warm. No rash noted.  Nursing note and vitals reviewed.    ED Treatments / Results  Labs (all labs ordered are listed, but only abnormal results are displayed) Labs Reviewed  BASIC METABOLIC PANEL - Abnormal; Notable for the following components:      Result Value   Glucose, Bld 150 (*)    All other components within normal limits  CBC - Abnormal; Notable for the following components:   Hemoglobin 15.9 (*)    All other components within normal limits    EKG None  Radiology Dg Chest 2 View  Result Date:  11/25/2017 CLINICAL DATA:  Pt states that she has been sick for the past three days. She has been having a headache, cough with green mucous, and nasal congestion. Pt states that she feels nauseaed and throws up when she coughs. Pt states she can only keep soup down. EXAM: CHEST - 2 VIEW COMPARISON:  05/17/2004 FINDINGS: No confluent airspace infiltrate. Central pulmonary vascular congestion Moderate cardiomegaly, new since previous. No effusion. Visualized bones unremarkable. IMPRESSION: Cardiomegaly with central pulmonary vascular congestion Electronically Signed   By: Corlis Leak  Hassell M.D.   On: 11/25/2017 09:23    Procedures Procedures (including critical care time)  Medications Ordered in ED Medications  ondansetron (ZOFRAN-ODT) disintegrating tablet 4 mg (has no administration in time range)     Initial Impression / Assessment and Plan / ED Course  I have reviewed the triage vital signs and the nursing notes.  Pertinent labs & imaging results that were available during my care of the patient were reviewed by me and considered in my medical decision making (see chart for details).     Symptoms suggestive of upper respiratory infection bronchitis versus pneumonia.  Will use chest x-ray to differentiate.  Patient nontoxic no acute distress.  Patient does not have a primary care provider so we will check basic labs.  Patient with significant hypertension here.  Kidney functions normal.  Chest x-ray suggestive of enlarged heart probably secondary to untreated hypertension.  No evidence of pneumonia.  Patient symptoms consistent with an upper respiratory infection bronchitis we will treat symptomatically with Mucinex DM, Motrin.  For the high blood pressure we will start her on hydrochlorothiazide give her referral to wellness clinic for further follow-up of the blood pressure.  Work note provided to be out of work till Monday.  Patient's blood pressure elevated here but no signs of  hypertensive emergency.  No evidence of pulmonary edema no severe headache renal function normal.  No neuro focal deficits.  Final Clinical Impressions(s) / ED Diagnoses   Final diagnoses:  Viral URI with cough  Bronchitis  Essential hypertension    ED Discharge Orders         Ordered    promethazine (PHENERGAN) 25 MG tablet  Every 6 hours PRN     11/25/17 1020    dextromethorphan-guaiFENesin (MUCINEX DM) 30-600 MG 12hr tablet  2 times daily     11/25/17 1020    ibuprofen (ADVIL,MOTRIN) 800 MG tablet  3 times daily     11/25/17 1020    hydrochlorothiazide (HYDRODIURIL) 25 MG tablet  Daily     11/25/17 1020           Vanetta MuldersZackowski, Khilynn Borntreger, MD 11/25/17 1025

## 2017-11-25 NOTE — ED Notes (Signed)
Pt understood dc material. NAD noted. Doctors excuse and scripts given at [facility]

## 2017-11-25 NOTE — Discharge Instructions (Signed)
Take the Mucinex as directed twice a day for the next 7 days.  Continue to take Motrin either 400 mg every 6 hours or 800 mg every 8 hours.  Take the Phenergan as needed for nausea.  Work note provided.  Return for any new or worse symptoms.  For the high blood pressure recommend to start taking the hydrochlorothiazide make an appointment with the wellness clinic for follow-up.

## 2017-12-24 ENCOUNTER — Emergency Department (HOSPITAL_BASED_OUTPATIENT_CLINIC_OR_DEPARTMENT_OTHER)
Admission: EM | Admit: 2017-12-24 | Discharge: 2017-12-25 | Disposition: A | Discharge status: Home / Self Care | Payer: 59 | Attending: Emergency Medicine | Admitting: Emergency Medicine

## 2017-12-24 ENCOUNTER — Encounter (HOSPITAL_BASED_OUTPATIENT_CLINIC_OR_DEPARTMENT_OTHER): Payer: Self-pay | Admitting: Adult Health

## 2017-12-24 ENCOUNTER — Other Ambulatory Visit: Payer: Self-pay

## 2017-12-24 DIAGNOSIS — Z79899 Other long term (current) drug therapy: Secondary | ICD-10-CM | POA: Diagnosis not present

## 2017-12-24 DIAGNOSIS — M5441 Lumbago with sciatica, right side: Secondary | ICD-10-CM | POA: Diagnosis not present

## 2017-12-24 DIAGNOSIS — M5442 Lumbago with sciatica, left side: Secondary | ICD-10-CM | POA: Insufficient documentation

## 2017-12-24 DIAGNOSIS — M545 Low back pain: Secondary | ICD-10-CM | POA: Diagnosis present

## 2017-12-24 DIAGNOSIS — I10 Essential (primary) hypertension: Secondary | ICD-10-CM | POA: Diagnosis not present

## 2017-12-24 MED ORDER — GABAPENTIN 300 MG PO CAPS
300.0000 mg | ORAL_CAPSULE | Freq: Once | ORAL | Status: AC
  Administered 2017-12-25: 300 mg via ORAL
  Filled 2017-12-24: qty 1

## 2017-12-24 MED ORDER — CYCLOBENZAPRINE HCL 5 MG PO TABS
5.0000 mg | ORAL_TABLET | Freq: Once | ORAL | Status: AC
  Administered 2017-12-25: 5 mg via ORAL
  Filled 2017-12-24: qty 1

## 2017-12-24 MED ORDER — KETOROLAC TROMETHAMINE 30 MG/ML IJ SOLN
30.0000 mg | Freq: Once | INTRAMUSCULAR | Status: AC
  Administered 2017-12-25: 30 mg via INTRAMUSCULAR
  Filled 2017-12-24: qty 1

## 2017-12-24 NOTE — ED Provider Notes (Signed)
[facility] EMERGENCY DEPARTMENT Provider Note   CSN: [id number] Arrival date & time: 12/24/17  2119     History   Chief Complaint Chief Complaint  Patient presents with  . Back Pain    HPI [name] is a 47 y.o. female.  HPI  This is a 47 year old female who presents with back pain.  Patient reports 2 to 3-week history of lower back, bilateral hip and leg pain.  She states that the pain radiates from her back down the back of her bilateral legs.  Pain is worse with ambulation.  She denies any injury.  She states that she does a lot of walking with her work but no significant heavy lifting.  Pain is worse with walking.  Currently she rates her pain at 10 out of 10.  She was seen at urgent care over 1 week ago.  She was given steroids, tramadol, muscle relaxant.  She had minimal relief while taking these medications.  She has run out.  She did take 800 mg of ibuprofen today.  She denies any bowel or bladder difficulty.  Denies any numbness or tingling of the lower extremities.  Denies any history of fevers, IV drug use, cancer.  Past Medical History:  Diagnosis Date  . Hypertension     There are no active problems to display for this patient.   Past Surgical History:  Procedure Laterality Date  . REPLACEMENT TOTAL KNEE BILATERAL       OB History   None      Home Medications    Prior to Admission medications   Medication Sig Start Date End Date Taking? Authorizing Provider  cyclobenzaprine (FLEXERIL) 5 MG tablet Take 1 tablet (5 mg total) by mouth 2 (two) times daily as needed. 12/25/17   Avraj Lindroth, Mayer Masker, MD  dextromethorphan-guaiFENesin (MUCINEX DM) 30-600 MG 12hr tablet Take 1 tablet by mouth 2 (two) times daily. 11/25/17   Vanetta Mulders, MD  gabapentin (NEURONTIN) 300 MG capsule Take 1 capsule (300 mg total) by mouth 3 (three) times daily. 12/25/17   Lyfe Reihl, Mayer Masker, MD  hydrochlorothiazide (HYDRODIURIL) 25 MG tablet Take 1 tablet (25 mg total)  by mouth daily. 11/25/17   Vanetta Mulders, MD  ibuprofen (ADVIL,MOTRIN) 600 MG tablet Take 1 tablet (600 mg total) by mouth every 6 (six) hours as needed. 12/25/17   Bastian Andreoli, Mayer Masker, MD  ibuprofen (ADVIL,MOTRIN) 800 MG tablet Take 1 tablet (800 mg total) by mouth 3 (three) times daily. 11/25/17   Vanetta Mulders, MD  promethazine (PHENERGAN) 25 MG tablet Take 1 tablet (25 mg total) by mouth every 6 (six) hours as needed for nausea or vomiting. 11/25/17   Vanetta Mulders, MD    Family History History reviewed. No pertinent family history.  Social History Social History   Tobacco Use  . Smoking status: Not on file  Substance Use Topics  . Alcohol use: Not on file  . Drug use: Not on file     Allergies   Naproxen   Review of Systems Review of Systems  Constitutional: Negative for fever.  Respiratory: Negative for shortness of breath.   Cardiovascular: Negative for chest pain.  Genitourinary: Negative for difficulty urinating and dysuria.  Musculoskeletal: Positive for back pain.  Neurological: Negative for weakness and numbness.  All other systems reviewed and are negative.    Physical Exam Updated Vital Signs BP (!) 224/106 (BP Location: Right Arm)   Pulse 80   Temp 98 F (36.7 C) (Oral)  Resp 17   Ht 1.727 m (5\' 8" )   Wt (!) 138 kg   SpO2 99%   BMI 46.26 kg/m   Physical Exam  Constitutional: She is oriented to person, place, and time. She appears well-developed and well-nourished. No distress.  HENT:  Head: Normocephalic and atraumatic.  Neck: Neck supple.  Cardiovascular: Normal rate and regular rhythm.  Pulmonary/Chest: Effort normal. No respiratory distress.  Musculoskeletal:  No midline L-spine tenderness to palpation, step-off, deformity, positive bilateral straight leg raise  Neurological: She is alert and oriented to person, place, and time.  5 out of 5 strength bilateral lower extremities, no clonus, normal and equal patellar reflexes bilaterally   Skin: Skin is warm and dry.  Psychiatric: She has a normal mood and affect.  Nursing note and vitals reviewed.    ED Treatments / Results  Labs (all labs ordered are listed, but only abnormal results are displayed) Labs Reviewed - No data to display  EKG None  Radiology No results found.  Procedures Procedures (including critical care time)  Medications Ordered in ED Medications  ketorolac (TORADOL) 30 MG/ML injection 30 mg (30 mg Intramuscular Given 12/25/17 0013)  gabapentin (NEURONTIN) capsule 300 mg (300 mg Oral Given 12/25/17 0013)  cyclobenzaprine (FLEXERIL) tablet 5 mg (5 mg Oral Given 12/25/17 0013)     Initial Impression / Assessment and Plan / ED Course  I have reviewed the triage vital signs and the nursing notes.  Pertinent labs & imaging results that were available during my care of the patient were reviewed by me and considered in my medical decision making (see chart for details).     She presents with back pain.  She is overall nontoxic-appearing.  Vital signs notable for hypertension with blood pressure of 224/106.  She is otherwise nontoxic-appearing.  No signs or symptoms of cauda equina.  History and physical exam is most suggestive of sciatica.  No known injury.  Feel x-rays are likely low yield.  She was treated with IM Toradol, gabapentin, Flexeril.  She remained hypertensive in the emergency room denies any related symptoms.  She reports that she was restarted on blood pressure medications just a few days ago and has taken her medications as prescribed.  She reports being on HCTZ" one other medication."  I discussed with her that she needs to have this rechecked in 1 to 2 weeks and likely need to have her medications adjusted.  On recheck, she does state that she feels much better after treatment.  Will discharge home with continued anti-inflammatories, muscle relaxant, and gabapentin.  Sports medicine follow-up provided.  After history, exam, and  medical workup I feel the patient has been appropriately medically screened and is safe for discharge home. Pertinent diagnoses were discussed with the patient. Patient was given return precautions.   Final Clinical Impressions(s) / ED Diagnoses   Final diagnoses:  Acute bilateral low back pain with bilateral sciatica  Essential hypertension    ED Discharge Orders         Ordered    gabapentin (NEURONTIN) 300 MG capsule  3 times daily     12/25/17 0055    cyclobenzaprine (FLEXERIL) 5 MG tablet  2 times daily PRN     12/25/17 0055    ibuprofen (ADVIL,MOTRIN) 600 MG tablet  Every 6 hours PRN     12/25/17 0055           Shon Baton, MD 12/25/17 [phone]

## 2017-12-24 NOTE — ED Notes (Signed)
Pt took 800 mg of ibuprofen at 1800 today

## 2017-12-24 NOTE — ED Triage Notes (Signed)
Presents with 2 weeks of lower back, hip and bilateral leg pain.She went to [facility] last week and was given a RX for tramadol and Ibuprofen. Neither are helping. Denies loss of B&B, denies numbness and tingling.

## 2017-12-25 MED ORDER — GABAPENTIN 300 MG PO CAPS: 300.0000 mg | ORAL_CAPSULE | Freq: Three times a day (TID) | ORAL | 0 refills | Status: AC

## 2017-12-25 MED ORDER — CYCLOBENZAPRINE HCL 5 MG PO TABS: 5.0000 mg | ORAL_TABLET | Freq: Two times a day (BID) | ORAL | 0 refills | Status: DC | PRN

## 2017-12-25 MED ORDER — IBUPROFEN 600 MG PO TABS: 600.0000 mg | ORAL_TABLET | Freq: Four times a day (QID) | ORAL | 0 refills | Status: AC | PRN

## 2017-12-25 NOTE — Discharge Instructions (Addendum)
Seen today for back pain.  Your pain is consistent with sciatica.  Continue medications as prescribed.  You may benefit from physical therapy.  Follow-up with sports medicine.  You were also notably hypertensive.  Continue your blood pressure medications.  You need to have your blood pressure rechecked the next 1 to 2 weeks and may need adjustment in your blood pressure medications.

## 2018-08-20 ENCOUNTER — Other Ambulatory Visit: Payer: Self-pay | Admitting: Nurse Practitioner

## 2018-08-20 DIAGNOSIS — Z1231 Encounter for screening mammogram for malignant neoplasm of breast: Secondary | ICD-10-CM

## 2018-11-22 ENCOUNTER — Emergency Department (HOSPITAL_BASED_OUTPATIENT_CLINIC_OR_DEPARTMENT_OTHER)
Admission: EM | Admit: 2018-11-22 | Discharge: 2018-11-22 | Disposition: A | Discharge status: Home / Self Care | Payer: 59 | Attending: Emergency Medicine | Admitting: Emergency Medicine

## 2018-11-22 ENCOUNTER — Encounter (HOSPITAL_BASED_OUTPATIENT_CLINIC_OR_DEPARTMENT_OTHER): Payer: Self-pay

## 2018-11-22 ENCOUNTER — Emergency Department (HOSPITAL_BASED_OUTPATIENT_CLINIC_OR_DEPARTMENT_OTHER): Payer: 59

## 2018-11-22 ENCOUNTER — Other Ambulatory Visit: Payer: Self-pay

## 2018-11-22 DIAGNOSIS — R1930 Abdominal rigidity, unspecified site: Secondary | ICD-10-CM | POA: Insufficient documentation

## 2018-11-22 DIAGNOSIS — I10 Essential (primary) hypertension: Secondary | ICD-10-CM | POA: Diagnosis not present

## 2018-11-22 DIAGNOSIS — Z96653 Presence of artificial knee joint, bilateral: Secondary | ICD-10-CM | POA: Diagnosis not present

## 2018-11-22 DIAGNOSIS — Z79899 Other long term (current) drug therapy: Secondary | ICD-10-CM | POA: Insufficient documentation

## 2018-11-22 DIAGNOSIS — M5126 Other intervertebral disc displacement, lumbar region: Secondary | ICD-10-CM | POA: Diagnosis not present

## 2018-11-22 DIAGNOSIS — F17210 Nicotine dependence, cigarettes, uncomplicated: Secondary | ICD-10-CM | POA: Diagnosis not present

## 2018-11-22 DIAGNOSIS — M5442 Lumbago with sciatica, left side: Secondary | ICD-10-CM | POA: Diagnosis not present

## 2018-11-22 DIAGNOSIS — M545 Low back pain: Secondary | ICD-10-CM | POA: Diagnosis present

## 2018-11-22 MED ORDER — DEXAMETHASONE SODIUM PHOSPHATE 10 MG/ML IJ SOLN
10.0000 mg | Freq: Once | INTRAMUSCULAR | Status: DC
  Filled 2018-11-22: qty 1

## 2018-11-22 MED ORDER — CYCLOBENZAPRINE HCL 5 MG PO TABS: 10.0000 mg | ORAL_TABLET | Freq: Three times a day (TID) | ORAL | 0 refills | Status: DC | PRN

## 2018-11-22 MED ORDER — ONDANSETRON 4 MG PO TBDP
4.0000 mg | ORAL_TABLET | Freq: Once | ORAL | Status: AC
  Administered 2018-11-22: 4 mg via ORAL
  Filled 2018-11-22: qty 1

## 2018-11-22 MED ORDER — LIDOCAINE 5 % EX PTCH: 1.0000 | MEDICATED_PATCH | CUTANEOUS | 0 refills | Status: AC

## 2018-11-22 MED ORDER — OXYCODONE HCL 5 MG PO TABS: 5.0000 mg | ORAL_TABLET | ORAL | 0 refills | Status: AC | PRN

## 2018-11-22 MED ORDER — DEXAMETHASONE SODIUM PHOSPHATE 10 MG/ML IJ SOLN
10.0000 mg | Freq: Once | INTRAMUSCULAR | Status: AC
  Administered 2018-11-22: 10 mg via INTRAMUSCULAR

## 2018-11-22 MED ORDER — HYDROMORPHONE HCL 1 MG/ML IJ SOLN
1.0000 mg | Freq: Once | INTRAMUSCULAR | Status: AC
  Administered 2018-11-22: 1 mg via INTRAMUSCULAR
  Filled 2018-11-22: qty 1

## 2018-11-22 NOTE — ED Notes (Signed)
ED Provider at bedside. 

## 2018-11-22 NOTE — ED Triage Notes (Signed)
Pt c/o left mid/lower back pain and left LE pain x 5 days-pain started after sexual activity-NAD-to triage w/c

## 2018-11-22 NOTE — ED Notes (Signed)
Patient transported to X-ray 

## 2018-11-22 NOTE — ED Provider Notes (Signed)
[facility] EMERGENCY DEPARTMENT Provider Note   CSN: [id number] Arrival date & time: 11/22/18  2118     History   Chief Complaint Chief Complaint  Patient presents with  . Back Pain    HPI [name] is a 48 y.o. female.     HPI  5 days ago developed left sided back pain, shooting down the left leg Began after having sexual intercourse, and thinks positioning contributed to pain Severe constant pain, worse with movement Different pain then back pain she has had before 15/10 pain Tried ibuprofen, gabapentin Constant not going away No fevers No vomiting No numbness or weakness  No loss of control of bowel or bladder   Past Medical History:  Diagnosis Date  . Hypertension     There are no active problems to display for this patient.   Past Surgical History:  Procedure Laterality Date  . ABDOMINAL HYSTERECTOMY    . REPLACEMENT TOTAL KNEE BILATERAL       OB History   No obstetric history on file.      Home Medications    Prior to Admission medications   Medication Sig Start Date End Date Taking? Authorizing Provider  cyclobenzaprine (FLEXERIL) 5 MG tablet Take 2 tablets (10 mg total) by mouth 3 (three) times daily as needed. 11/22/18   Gareth Morgan, MD  gabapentin (NEURONTIN) 300 MG capsule Take 1 capsule (300 mg total) by mouth 3 (three) times daily. 12/25/17   Horton, Barbette Hair, MD  hydrochlorothiazide (HYDRODIURIL) 25 MG tablet Take 1 tablet (25 mg total) by mouth daily. 11/25/17   Fredia Sorrow, MD  ibuprofen (ADVIL,MOTRIN) 600 MG tablet Take 1 tablet (600 mg total) by mouth every 6 (six) hours as needed. 12/25/17   Horton, Barbette Hair, MD  ibuprofen (ADVIL,MOTRIN) 800 MG tablet Take 1 tablet (800 mg total) by mouth 3 (three) times daily. 11/25/17   Fredia Sorrow, MD  lidocaine (LIDODERM) 5 % Place 1 patch onto the skin daily. Remove & Discard patch within 12 hours or as directed by MD 11/22/18   Gareth Morgan, MD  oxyCODONE  (ROXICODONE) 5 MG immediate release tablet Take 1 tablet (5 mg total) by mouth every 4 (four) hours as needed for severe pain. 11/22/18   Gareth Morgan, MD  promethazine (PHENERGAN) 25 MG tablet Take 1 tablet (25 mg total) by mouth every 6 (six) hours as needed for nausea or vomiting. 11/25/17 11/22/18  Fredia Sorrow, MD    Family History No family history on file.  Social History Social History   Tobacco Use  . Smoking status: Current Every Day Smoker    Types: Cigarettes  . Smokeless tobacco: Never Used  Substance Use Topics  . Alcohol use: Not on file    Comment: occ  . Drug use: Never     Allergies   Naproxen   Review of Systems Review of Systems  Constitutional: Negative for fever.  Eyes: Negative for visual disturbance.  Respiratory: Negative for cough and shortness of breath.   Cardiovascular: Negative for chest pain.  Gastrointestinal: Negative for abdominal pain (mild tightness in stomach for last 5 days) and vomiting.  Genitourinary: Negative for difficulty urinating and dysuria.  Musculoskeletal: Positive for back pain. Negative for neck pain.  Skin: Negative for rash.  Neurological: Negative for syncope and headaches.     Physical Exam Updated Vital Signs BP (!) 185/100   Pulse 72   Temp 99 F (37.2 C) (Oral)   Resp 16   Ht 5'  8" (1.727 m)   Wt 125.6 kg   SpO2 98%   BMI 42.12 kg/m   Physical Exam Vitals signs and nursing note reviewed.  Constitutional:      General: She is not in acute distress.    Appearance: She is well-developed. She is not diaphoretic.  HENT:     Head: Normocephalic and atraumatic.  Eyes:     Conjunctiva/sclera: Conjunctivae normal.  Neck:     Musculoskeletal: Normal range of motion.  Cardiovascular:     Rate and Rhythm: Normal rate and regular rhythm.     Heart sounds: Normal heart sounds. No murmur. No friction rub. No gallop.   Pulmonary:     Effort: Pulmonary effort is normal. No respiratory distress.      Breath sounds: Normal breath sounds. No wheezing or rales.  Abdominal:     General: There is no distension.     Palpations: Abdomen is soft.     Tenderness: There is no abdominal tenderness. There is no guarding.  Musculoskeletal:     Lumbar back: She exhibits tenderness and bony tenderness.  Skin:    General: Skin is warm and dry.     Findings: No erythema or rash.  Neurological:     Mental Status: She is alert and oriented to person, place, and time.     GCS: GCS eye subscore is 4. GCS verbal subscore is 5. GCS motor subscore is 6.     Sensory: Sensation is intact.     Motor: Motor function is intact.      ED Treatments / Results  Labs (all labs ordered are listed, but only abnormal results are displayed) Labs Reviewed - No data to display  EKG None  Radiology Dg Lumbar Spine Complete  Result Date: 11/22/2018 CLINICAL DATA:  Back pain EXAM: LUMBAR SPINE - COMPLETE 4+ VIEW COMPARISON:  None. FINDINGS: Degenerative changes at L3-4 with disc space narrowing and anterior spurring. Normal alignment. No fracture. SI joints symmetric and unremarkable. IMPRESSION: No acute bony abnormality. Electronically Signed   By: Charlett NoseKevin  Dover M.D.   On: 11/22/2018 23:11    Procedures Procedures (including critical care time)  Medications Ordered in ED Medications  HYDROmorphone (DILAUDID) injection 1 mg (1 mg Intramuscular Given 11/22/18 2308)  dexamethasone (DECADRON) injection 10 mg (10 mg Intramuscular Given 11/22/18 2308)  ondansetron (ZOFRAN-ODT) disintegrating tablet 4 mg (4 mg Oral Given 11/22/18 2317)     Initial Impression / Assessment and Plan / ED Course  I have reviewed the triage vital signs and the nursing notes.  Pertinent labs & imaging results that were available during my care of the patient were reviewed by me and considered in my medical decision making (see chart for details).        48 year old female with a history of hypertension presents with concern for 5  days of severe back pain radiating down the left leg.  X-ray shows no evidence of acute fracture.  She has a benign abdominal exam, low suspicion for intra-abdominal etiology of pain.  She does not have any dysuria, frequency, or flank pain given radiation of pain suspect more likely disc herniation than urinary tract infection or pyelonephritis.  Patient has a normal neurologic exam and denies any urinary retention or overflow incontinence, stool incontinence, saddle anesthesia, fever, IV drug use, and have low suspicion suspicion for cauda equina, epidural abscess, or vertebral osteomyelitis.   By history and exam, suspect most likely disc herniation as etiology of pain.  She is given  a dose of Decadron and Dilaudid in the emergency department.  Given borderline diabetes, will only do 1 dose of Decadron right now, will not write further steroids at this time.  She receives chronic pain medications from her PCP.  She was reviewed in the [facility] drug database, and is due to pick up new prescription soon.  Given severity of pain, I did write 5 tablets of oxycodone to help until she receives her new prescription.  Also gave prescription for Flexeril and lidocaine.  Recommend close primary care physician follow-up for continued pain management, likely physical therapy. Patient discharged in stable condition with understanding of reasons to return.   Final Clinical Impressions(s) / ED Diagnoses   Final diagnoses:  Lumbar disc herniation  Acute left-sided low back pain with left-sided sciatica    ED Discharge Orders         Ordered    cyclobenzaprine (FLEXERIL) 5 MG tablet  3 times daily PRN,   Status:  Discontinued     11/22/18 2320    lidocaine (LIDODERM) 5 %  Every 24 hours     11/22/18 2320    oxyCODONE (ROXICODONE) 5 MG immediate release tablet  Every 4 hours PRN     11/22/18 2320    cyclobenzaprine (FLEXERIL) 5 MG tablet  3 times daily PRN     11/22/18 2321           Alvira Monday, MD  11/23/18 1242

## 2019-10-03 ENCOUNTER — Other Ambulatory Visit: Payer: Self-pay | Admitting: Sports Medicine

## 2019-10-03 DIAGNOSIS — M545 Low back pain, unspecified: Secondary | ICD-10-CM

## 2019-10-04 ENCOUNTER — Encounter (HOSPITAL_BASED_OUTPATIENT_CLINIC_OR_DEPARTMENT_OTHER): Payer: Self-pay

## 2019-10-04 ENCOUNTER — Other Ambulatory Visit: Payer: Self-pay

## 2019-10-04 ENCOUNTER — Emergency Department (HOSPITAL_BASED_OUTPATIENT_CLINIC_OR_DEPARTMENT_OTHER)
Admission: EM | Admit: 2019-10-04 | Discharge: 2019-10-04 | Disposition: A | Discharge status: Home / Self Care | Payer: 59 | Attending: Emergency Medicine | Admitting: Emergency Medicine

## 2019-10-04 DIAGNOSIS — Z5321 Procedure and treatment not carried out due to patient leaving prior to being seen by health care provider: Secondary | ICD-10-CM | POA: Diagnosis not present

## 2019-10-04 DIAGNOSIS — M549 Dorsalgia, unspecified: Secondary | ICD-10-CM | POA: Insufficient documentation

## 2019-10-04 DIAGNOSIS — M79605 Pain in left leg: Secondary | ICD-10-CM | POA: Diagnosis not present

## 2019-10-04 HISTORY — DX: Type 2 diabetes mellitus without complications: E11.9

## 2019-10-04 NOTE — ED Triage Notes (Signed)
Pt c/o L sided back pain with radiation into the L leg x 1 week. Pt denies injury.

## 2019-10-16 ENCOUNTER — Other Ambulatory Visit: Payer: 59

## 2019-10-22 ENCOUNTER — Other Ambulatory Visit: Payer: Self-pay

## 2019-10-22 ENCOUNTER — Encounter (HOSPITAL_COMMUNITY): Payer: Self-pay

## 2019-10-22 ENCOUNTER — Emergency Department (HOSPITAL_COMMUNITY)
Admission: EM | Admit: 2019-10-22 | Discharge: 2019-10-22 | Disposition: A | Discharge status: Home / Self Care | Payer: 59 | Attending: Emergency Medicine | Admitting: Emergency Medicine

## 2019-10-22 DIAGNOSIS — F17210 Nicotine dependence, cigarettes, uncomplicated: Secondary | ICD-10-CM | POA: Insufficient documentation

## 2019-10-22 DIAGNOSIS — I10 Essential (primary) hypertension: Secondary | ICD-10-CM | POA: Insufficient documentation

## 2019-10-22 DIAGNOSIS — M5410 Radiculopathy, site unspecified: Secondary | ICD-10-CM

## 2019-10-22 DIAGNOSIS — M5442 Lumbago with sciatica, left side: Secondary | ICD-10-CM | POA: Insufficient documentation

## 2019-10-22 DIAGNOSIS — E119 Type 2 diabetes mellitus without complications: Secondary | ICD-10-CM | POA: Diagnosis not present

## 2019-10-22 DIAGNOSIS — M79605 Pain in left leg: Secondary | ICD-10-CM | POA: Diagnosis present

## 2019-10-22 MED ORDER — PREDNISONE 10 MG PO TABS: 20.0000 mg | ORAL_TABLET | Freq: Two times a day (BID) | ORAL | 0 refills | Status: AC

## 2019-10-22 MED ORDER — PREDNISONE 20 MG PO TABS
40.0000 mg | ORAL_TABLET | Freq: Once | ORAL | Status: AC
  Administered 2019-10-22: 40 mg via ORAL
  Filled 2019-10-22: qty 2

## 2019-10-22 MED ORDER — OXYCODONE-ACETAMINOPHEN 5-325 MG PO TABS: 1.0000 | ORAL_TABLET | Freq: Four times a day (QID) | ORAL | 0 refills | Status: AC | PRN

## 2019-10-22 MED ORDER — OXYCODONE-ACETAMINOPHEN 5-325 MG PO TABS
1.0000 | ORAL_TABLET | Freq: Four times a day (QID) | ORAL | 0 refills | Status: DC | PRN
Start: 2019-10-22 — End: 2019-10-22

## 2019-10-22 MED ORDER — PREDNISONE 10 MG PO TABS: 20.0000 mg | ORAL_TABLET | Freq: Two times a day (BID) | ORAL | 0 refills | Status: DC

## 2019-10-22 MED ORDER — HYDROMORPHONE HCL 2 MG/ML IJ SOLN
2.0000 mg | Freq: Once | INTRAMUSCULAR | Status: AC
  Administered 2019-10-22: 2 mg via INTRAMUSCULAR
  Filled 2019-10-22: qty 1

## 2019-10-22 NOTE — Discharge Instructions (Signed)
Begin taking prednisone as prescribed.  Take Percocet as prescribed as needed for pain.  Follow-up with your primary doctor if symptoms or not improving in the next week to discuss physical therapy or imaging studies.  Return to the emergency department in the meantime if your symptoms significantly worsen or change.

## 2019-10-22 NOTE — ED Notes (Signed)
Discharge paperwork reviewed with pt.  Pt requests that prescriptions be sent to different pharmacy than listed.  EDP Delo made aware and changed pharmacy, per pt request. Pt encouraged to take BP meds upon arrival to her home. Pt ambulatory at time of discharge.

## 2019-10-22 NOTE — ED Triage Notes (Signed)
Complains of L flank pain that radiates down her leg, burning sensation, states she has to drag her leg to walk. Hx of sciatica. Hx of HTN, has not taken HTN meds this AM.

## 2019-10-22 NOTE — ED Notes (Signed)
EDP Delo made aware of pts elevated BP (pt reports not taking BP meds today), recommends pt to take home BP meds.

## 2019-10-22 NOTE — ED Provider Notes (Signed)
[facility]-EMERGENCY DEPT Provider Note   CSN: [id number] Arrival date & time: 10/22/19  0932     History Chief Complaint  Patient presents with  . Leg Pain    left sciatic pain    [name] is a 49 y.o. female.  ShePatient is a 49 year old female with history of diabetes and hypertension.  She presents today for evaluation of back pain.  Patient describes pain in her left lumbar region that radiates into her left buttock and down her left leg.  This began several days ago in the absence of any injury or trauma.  She denies any bowel or bladder complaints.  She has difficulty bearing weight secondary to pain, but denies any weakness or numbness.  Her pain is worse with twisting and leaning.  She denies any fevers or chills.  The history is provided by the patient.       Past Medical History:  Diagnosis Date  . Diabetes mellitus without complication (HCC)   . Hypertension     There are no problems to display for this patient.   Past Surgical History:  Procedure Laterality Date  . ABDOMINAL HYSTERECTOMY    . REPLACEMENT TOTAL KNEE BILATERAL    . REPLACEMENT TOTAL KNEE BILATERAL       OB History   No obstetric history on file.     No family history on file.  Social History   Tobacco Use  . Smoking status: Current Every Day Smoker    Types: Cigarettes  . Smokeless tobacco: Never Used  Vaping Use  . Vaping Use: Never used  Substance Use Topics  . Alcohol use: Yes    Alcohol/week: 5.0 standard drinks    Types: 5 Cans of beer per week    Comment: occ  . Drug use: Never    Home Medications Prior to Admission medications   Medication Sig Start Date End Date Taking? Authorizing Provider  cyclobenzaprine (FLEXERIL) 5 MG tablet Take 2 tablets (10 mg total) by mouth 3 (three) times daily as needed. 11/22/18   Alvira Monday, MD  gabapentin (NEURONTIN) 300 MG capsule Take 1 capsule (300 mg total) by mouth 3 (three) times daily. 12/25/17    Horton, Mayer Masker, MD  hydrochlorothiazide (HYDRODIURIL) 25 MG tablet Take 1 tablet (25 mg total) by mouth daily. 11/25/17   Vanetta Mulders, MD  ibuprofen (ADVIL,MOTRIN) 600 MG tablet Take 1 tablet (600 mg total) by mouth every 6 (six) hours as needed. 12/25/17   Horton, Mayer Masker, MD  ibuprofen (ADVIL,MOTRIN) 800 MG tablet Take 1 tablet (800 mg total) by mouth 3 (three) times daily. 11/25/17   Vanetta Mulders, MD  lidocaine (LIDODERM) 5 % Place 1 patch onto the skin daily. Remove & Discard patch within 12 hours or as directed by MD 11/22/18   Alvira Monday, MD  oxyCODONE (ROXICODONE) 5 MG immediate release tablet Take 1 tablet (5 mg total) by mouth every 4 (four) hours as needed for severe pain. 11/22/18   Alvira Monday, MD  promethazine (PHENERGAN) 25 MG tablet Take 1 tablet (25 mg total) by mouth every 6 (six) hours as needed for nausea or vomiting. 11/25/17 11/22/18  Vanetta Mulders, MD    Allergies    Naproxen  Review of Systems   Review of Systems  All other systems reviewed and are negative.   Physical Exam Updated Vital Signs BP (!) 187/109 (BP Location: Right Arm)   Pulse 64   Temp 99 F (37.2 C) (Oral)   Resp  18   Ht 5\' 8"  (1.727 m)   Wt 129.3 kg   SpO2 100%   BMI 43.33 kg/m   Physical Exam Vitals and nursing note reviewed.  Constitutional:      General: She is not in acute distress.    Appearance: Normal appearance. She is not ill-appearing, toxic-appearing or diaphoretic.  HENT:     Head: Normocephalic and atraumatic.  Pulmonary:     Effort: Pulmonary effort is normal.  Musculoskeletal:     Cervical back: Normal range of motion and neck supple.     Comments: There is tenderness to palpation in the soft tissues of the left lower lumbar region extending into the left buttock.  Skin:    General: Skin is warm and dry.  Neurological:     Mental Status: She is alert.     Comments: Strength is 5 out of 5 in both lower extremities.  DTRs are trace and  symmetrical in the patellar and Achilles tendons bilaterally.  She is able to ambulate, however with an antalgic gait.     ED Results / Procedures / Treatments   Labs (all labs ordered are listed, but only abnormal results are displayed) Labs Reviewed - No data to display  EKG None  Radiology No results found.  Procedures Procedures (including critical care time)  Medications Ordered in ED Medications  HYDROmorphone (DILAUDID) injection 2 mg (has no administration in time range)  predniSONE (DELTASONE) tablet 40 mg (has no administration in time range)    ED Course  I have reviewed the triage vital signs and the nursing notes.  Pertinent labs & imaging results that were available during my care of the patient were reviewed by me and considered in my medical decision making (see chart for details).    MDM Rules/Calculators/A&P  Patient presenting here with complaints of low back pain that is radicular in nature.  Patient will be treated with steroids and pain medicine.  She is to follow-up with primary doctor if not improving in the next week to discuss physical therapy or possibly imaging studies.  There are no red flags today that would suggest an emergent situation.  Her strength and reflexes are symmetrical and there are no bowel or bladder complaints.  She is able to ambulate in the ER.  Final Clinical Impression(s) / ED Diagnoses Final diagnoses:  None    Rx / DC Orders ED Discharge Orders    None       , MD 10/22/19 1150

## 2019-11-15 ENCOUNTER — Other Ambulatory Visit: Payer: Self-pay | Admitting: Sports Medicine

## 2019-11-20 ENCOUNTER — Ambulatory Visit
Admission: RE | Admit: 2019-11-20 | Discharge: 2019-11-20 | Disposition: A | Discharge status: Home / Self Care | Payer: 59 | Source: Ambulatory Visit | Attending: Sports Medicine | Admitting: Sports Medicine

## 2019-11-20 DIAGNOSIS — M545 Low back pain, unspecified: Secondary | ICD-10-CM

## 2021-02-20 IMAGING — MR MR LUMBAR SPINE W/O CM
4 of 5 series · 18 of 48 positions shown · non-contrast
Comparison: 11/22/2018 lumbar spine radiographs and prior.

CLINICAL DATA: Lower back pain.

EXAM:
MRI LUMBAR SPINE WITHOUT CONTRAST
TECHNIQUE: Multiplanar, multisequence MR imaging of the lumbar spine was
performed. No intravenous contrast was administered.

[Series 5: T2 · sagittal · 4.0mm · 0.73mm/px · 6 of 15 slices shown (1 of 2)]
[im 1/15]
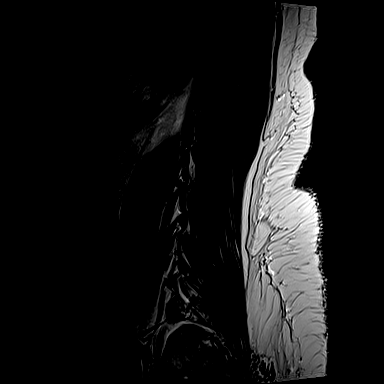
[im 3/15]
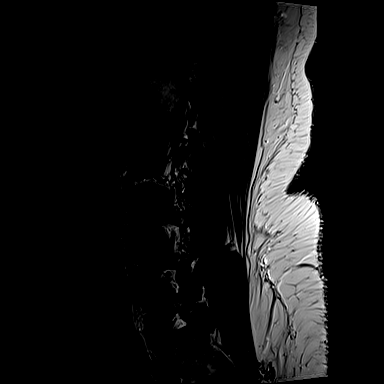
[im 6/15]
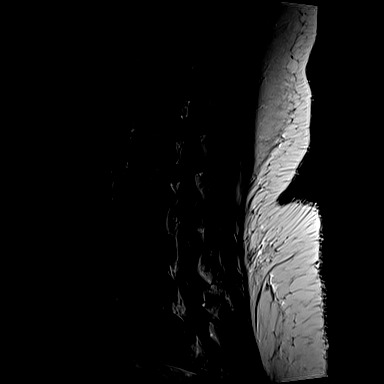
[im 9/15]
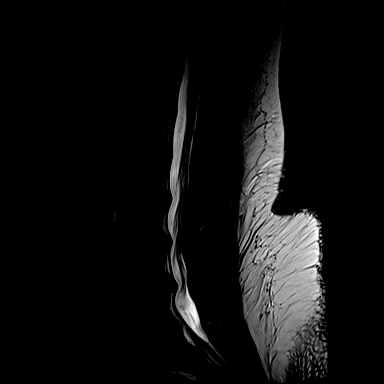
[im 12/15]
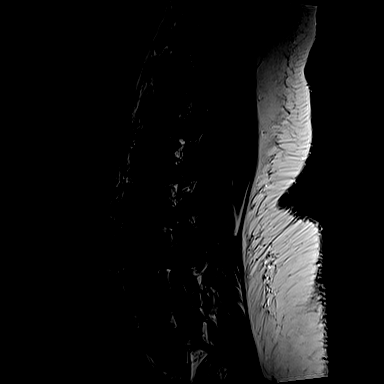
[im 15/15]
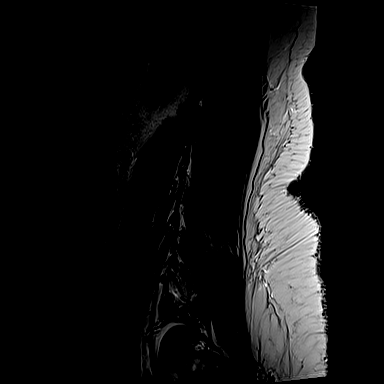

[Series 6: T1 · sagittal · 4.0mm · 0.73mm/px · 3 of 15 slices shown (1 of 2)]
[im 3/15]
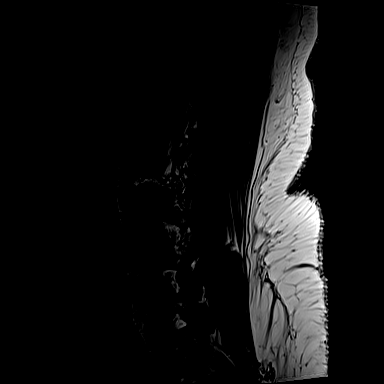
[im 9/15]
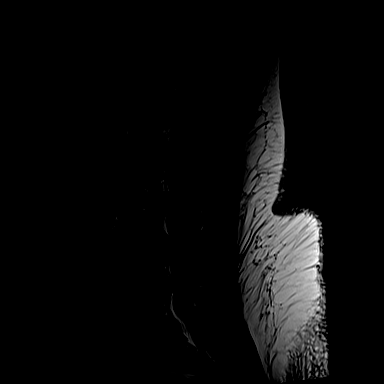
[im 15/15]
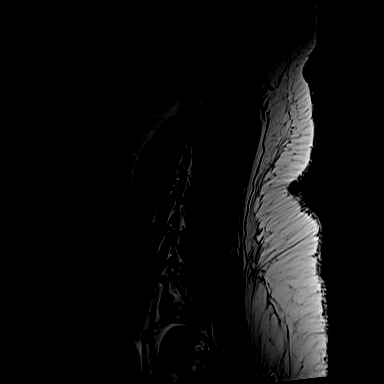

[Series 10: T1 · axial · 4.0mm · 0.28mm/px · z∈[-124,+18]mm · 3 of 39 slices shown (2 of 2)]
[im 6/39]
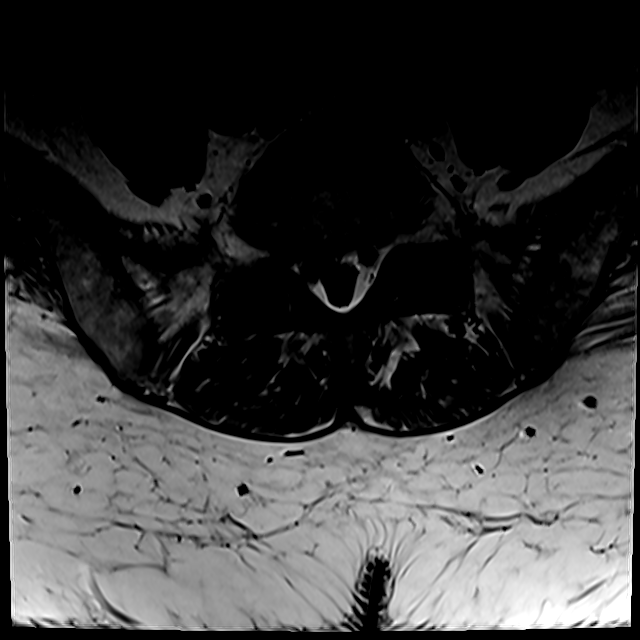
[im 20/39]
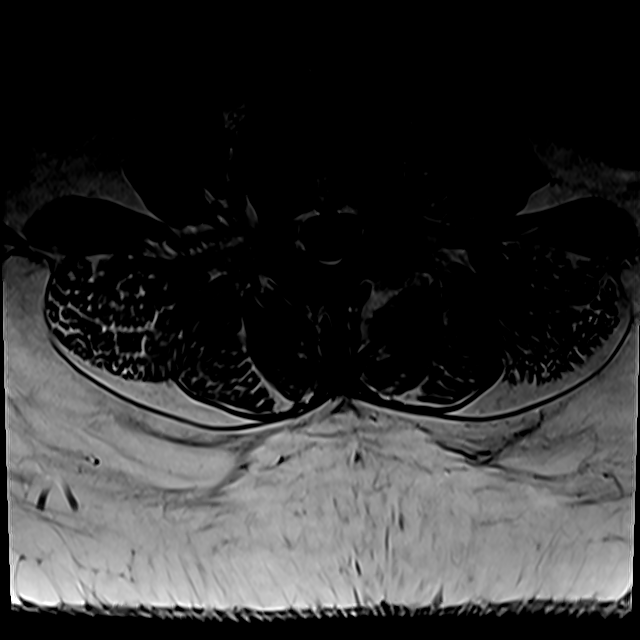
[im 33/39]
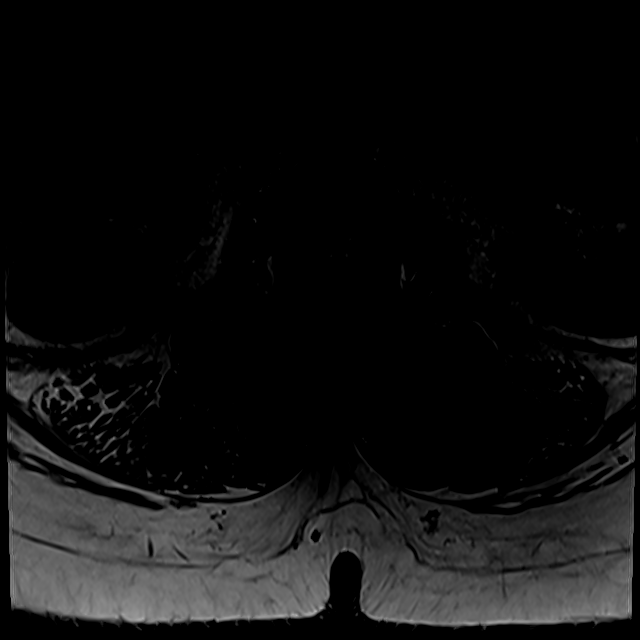

[Series 13: T2 · axial · 4.0mm · 0.28mm/px · z∈[-149,+18]mm · 6 of 39 slices shown (2 of 2)]
[im 1/39]
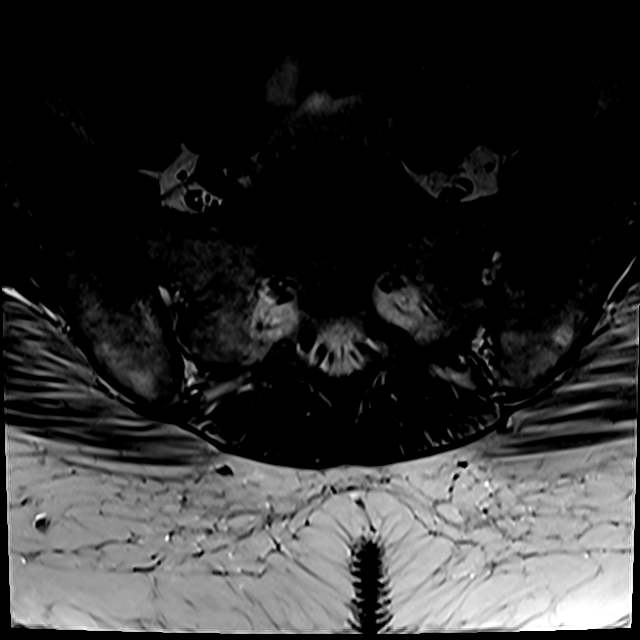
[im 6/39]
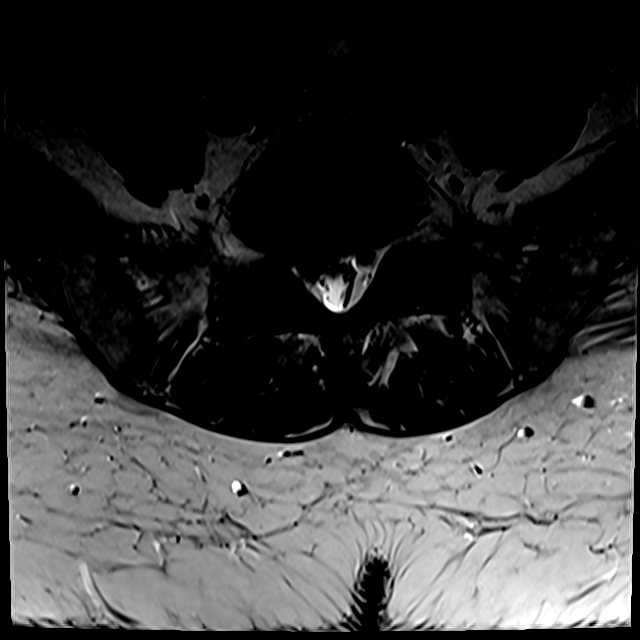
[im 11/39]
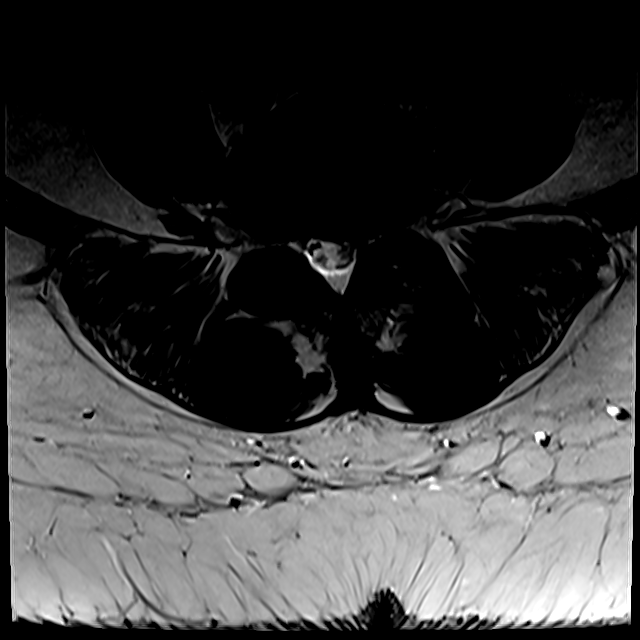
[im 17/39]
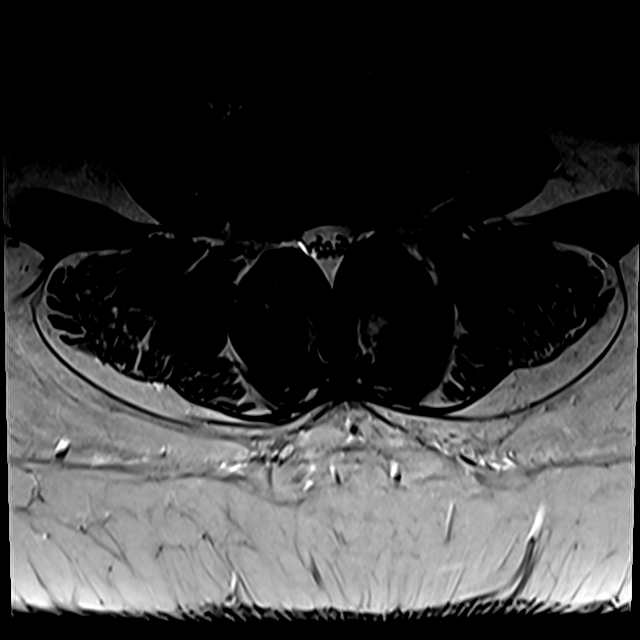
[im 20/39]
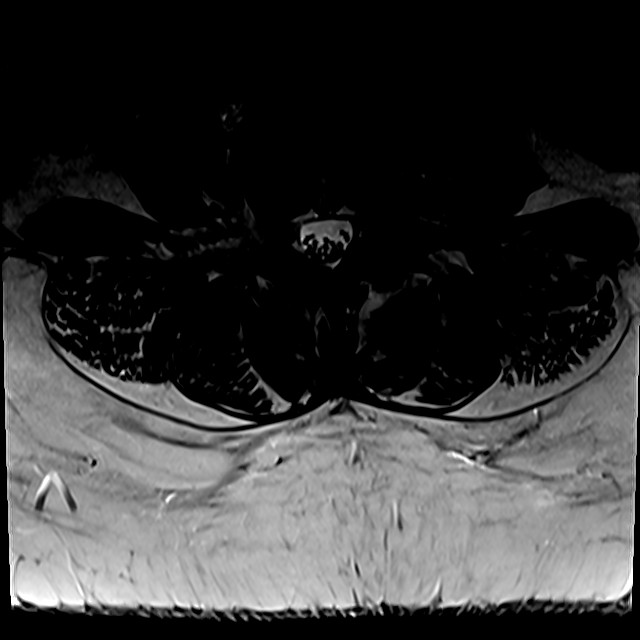
[im 33/39]
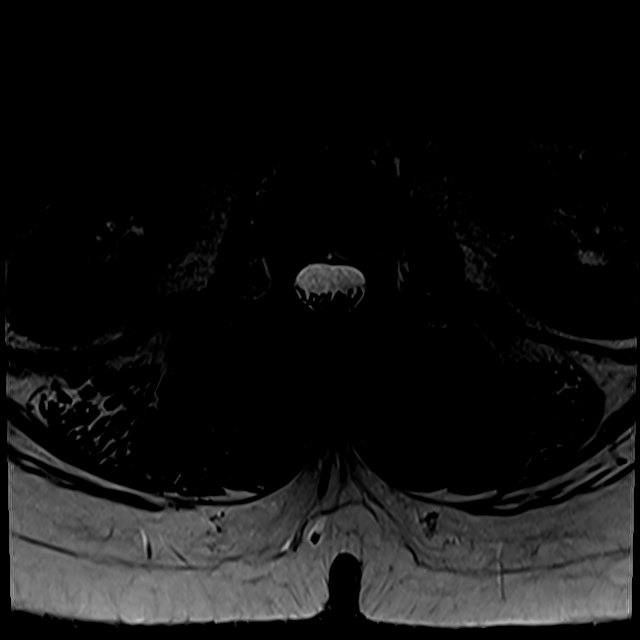

[18 of 48 positions shown; findings below may reference images not displayed]

FINDINGS: Segmentation:  Standard.

Alignment:  Normal.

Vertebrae: Mild bone marrow heterogeneity. Scattered T1/T2
hyperintense foci reflect hemangiomata versus focal fat.

Conus medullaris and cauda equina: Conus extends to the L1 level.
Conus and cauda equina appear normal.

Disc levels: Multilevel desiccation and disc space loss most
prominent at the L3-4 and L4-5 level.

L1-2: No significant disc bulge, spinal canal or neural foraminal
narrowing.

L2-3: Disc bulge with superimposed right foraminal protrusion,
ligamentum flavum and bilateral facet hypertrophy. Mild spinal canal
and bilateral neural foraminal narrowing.

L3-4: Disc bulge abutting the bilateral exiting L3 and descending L4
nerve roots. Ligamentum flavum and bilateral facet hypertrophy. Mild
spinal canal and bilateral neural foraminal narrowing.

L4-5: Disc bulge with superimposed left paracentral/subarticular
protrusion with annular fissuring abutting the descending left L5
nerve root and effacing the lateral recess. There is abutment of the
descending right L5 nerve root to a lesser extent. Mild spinal
canal, moderate left and mild right neural foraminal narrowing.

L5-S1: Disc bulge with superimposed inferiorly oriented right
subarticular extrusion with undersurface annular fissuring abutting
the descending right S1 nerve root and effacing the lateral recess.
Bilateral facet hypertrophy. Patent spinal canal and left neural
foramen. Mild right neural foraminal narrowing.

Paraspinal and other soft tissues: Negative.
IMPRESSION: Left L4-5 paracentral/subarticular protrusion and annular fissuring
abutting the L5 nerve root with moderate left neural foraminal
narrowing.

Inferiorly oriented right L5-S1 subarticular extrusion/annular
fissuring abutting the descending right S1 nerve root with mild
neural foraminal narrowing.

Mild L2-5 spinal canal narrowing. Mild bilateral L2-4 and right L4-5
neural foraminal narrowing.

## 2022-03-31 ENCOUNTER — Other Ambulatory Visit: Payer: Self-pay

## 2022-03-31 DIAGNOSIS — M47816 Spondylosis without myelopathy or radiculopathy, lumbar region: Secondary | ICD-10-CM

## 2022-04-04 ENCOUNTER — Other Ambulatory Visit: Payer: Self-pay | Admitting: Physician Assistant

## 2022-04-04 DIAGNOSIS — M47816 Spondylosis without myelopathy or radiculopathy, lumbar region: Secondary | ICD-10-CM

## 2022-04-11 ENCOUNTER — Encounter (HOSPITAL_BASED_OUTPATIENT_CLINIC_OR_DEPARTMENT_OTHER): Payer: Self-pay

## 2022-04-11 ENCOUNTER — Emergency Department (HOSPITAL_BASED_OUTPATIENT_CLINIC_OR_DEPARTMENT_OTHER)
Admission: EM | Admit: 2022-04-11 | Discharge: 2022-04-11 | Disposition: A | Discharge status: Home / Self Care | Payer: No Typology Code available for payment source | Attending: Emergency Medicine | Admitting: Emergency Medicine

## 2022-04-11 ENCOUNTER — Other Ambulatory Visit: Payer: Self-pay

## 2022-04-11 DIAGNOSIS — M5116 Intervertebral disc disorders with radiculopathy, lumbar region: Secondary | ICD-10-CM | POA: Diagnosis not present

## 2022-04-11 DIAGNOSIS — I10 Essential (primary) hypertension: Secondary | ICD-10-CM | POA: Insufficient documentation

## 2022-04-11 DIAGNOSIS — M5136 Other intervertebral disc degeneration, lumbar region: Secondary | ICD-10-CM

## 2022-04-11 DIAGNOSIS — Z79899 Other long term (current) drug therapy: Secondary | ICD-10-CM | POA: Insufficient documentation

## 2022-04-11 DIAGNOSIS — R030 Elevated blood-pressure reading, without diagnosis of hypertension: Secondary | ICD-10-CM

## 2022-04-11 DIAGNOSIS — M5432 Sciatica, left side: Secondary | ICD-10-CM

## 2022-04-11 MED ORDER — METHOCARBAMOL 750 MG PO TABS: 750.0000 mg | ORAL_TABLET | Freq: Three times a day (TID) | ORAL | 0 refills | Status: AC | PRN

## 2022-04-11 MED ORDER — HYDROMORPHONE HCL 1 MG/ML IJ SOLN
1.0000 mg | Freq: Once | INTRAMUSCULAR | Status: AC
  Administered 2022-04-11: 1 mg via INTRAMUSCULAR
  Filled 2022-04-11: qty 1

## 2022-04-11 NOTE — Discharge Instructions (Addendum)
It was our pleasure to provide your ER care today - we hope that you feel better.  Avoid bending at waist or heavy lifting > 20 lbs. Try finding position of comfort, for example, lying on side with pillow between legs, or lying on back with pillow behind knees.  Take ibuprofen as need for pain. You may also take your pain medication as need.  You may also try robaxin as need for pain/spasm - no driving for the next 6 hours or when taking robaxin.  Follow up with spine specialist in the next 1-2 weeks - call office to arrange appointment.  Your blood pressure is high today  - continue your blood pressure medication, limit salt intake, and follow up closely with primary care doctor for recheck in the coming week.   Return to ER if worse, new symptoms, fevers, severe/intractable pain, problems with normal bowel/bladder function, numbness/weakness, or other concern.

## 2022-04-11 NOTE — ED Notes (Signed)
Pt. Reports she is having low back pain and into the L leg for several days.  Pt. Is using a cane to walk.

## 2022-04-11 NOTE — ED Provider Notes (Addendum)
[facility] Provider Note   CSN: [id number] Arrival date & time: 04/11/22  2008     History  Chief Complaint  Patient presents with   Sciatica    Quynn Vilchis is a 52 y.o. female.  Patient with hx ddd/sciatica, c/o pain from left buttock radiating down left leg c/w prior sciatica pain. Symptoms present in past several weeks. Indicates ~ 2 weeks ago had kenalog and toradol IM for same - pain not significantly relieved since. No acute or abrupt worsening today, but rather persistence of pain. No saddle area or leg numbness. No weakness or loss of normal functional ability. No urinary retention, or incontinence. No problems w normal bms. No fever or chills.    The history is provided by the patient and medical records.       Home Medications Prior to Admission medications   Medication Sig Start Date End Date Taking? Authorizing Provider  cyclobenzaprine (FLEXERIL) 5 MG tablet Take 2 tablets (10 mg total) by mouth 3 (three) times daily as needed. 11/22/18   Alvira Monday, MD  gabapentin (NEURONTIN) 300 MG capsule Take 1 capsule (300 mg total) by mouth 3 (three) times daily. 12/25/17   Horton, Mayer Masker, MD  hydrochlorothiazide (HYDRODIURIL) 25 MG tablet Take 1 tablet (25 mg total) by mouth daily. 11/25/17   Vanetta Mulders, MD  ibuprofen (ADVIL,MOTRIN) 600 MG tablet Take 1 tablet (600 mg total) by mouth every 6 (six) hours as needed. 12/25/17   Horton, Mayer Masker, MD  ibuprofen (ADVIL,MOTRIN) 800 MG tablet Take 1 tablet (800 mg total) by mouth 3 (three) times daily. 11/25/17   Vanetta Mulders, MD  lidocaine (LIDODERM) 5 % Place 1 patch onto the skin daily. Remove & Discard patch within 12 hours or as directed by MD 11/22/18   Alvira Monday, MD  oxyCODONE (ROXICODONE) 5 MG immediate release tablet Take 1 tablet (5 mg total) by mouth every 4 (four) hours as needed for severe pain. 11/22/18   Alvira Monday, MD  oxyCODONE-acetaminophen  (PERCOCET) 5-325 MG tablet Take 1-2 tablets by mouth every 6 (six) hours as needed. 10/22/19   Geoffery Lyons, MD  predniSONE (DELTASONE) 10 MG tablet Take 2 tablets (20 mg total) by mouth 2 (two) times daily with a meal. 10/22/19   Geoffery Lyons, MD  promethazine (PHENERGAN) 25 MG tablet Take 1 tablet (25 mg total) by mouth every 6 (six) hours as needed for nausea or vomiting. 11/25/17 11/22/18  Vanetta Mulders, MD      Allergies    Naproxen    Review of Systems   Review of Systems  Constitutional:  Negative for chills, diaphoresis and fever.  Respiratory:  Negative for shortness of breath.   Cardiovascular:  Negative for chest pain and leg swelling.  Gastrointestinal:  Negative for abdominal pain.  Genitourinary:  Negative for difficulty urinating and flank pain.  Musculoskeletal:  Positive for back pain.  Skin:  Negative for rash.  Neurological:  Negative for weakness, numbness and headaches.  Hematological:  Does not bruise/bleed easily.  Psychiatric/Behavioral:  Negative for confusion.     Physical Exam Updated Vital Signs BP (!) 182/107 (BP Location: Right Arm)   Pulse 63   Temp 98.2 F (36.8 C) (Oral)   Resp 18   Ht 1.727 m (5\' 8" )   Wt 136.1 kg   SpO2 96%   BMI 45.61 kg/m  Physical Exam Vitals and nursing note reviewed.  Constitutional:      Appearance: Normal appearance.  She is well-developed.  HENT:     Head: Atraumatic.     Nose: Nose normal.     Mouth/Throat:     Mouth: Mucous membranes are moist.  Eyes:     General: No scleral icterus.    Conjunctiva/sclera: Conjunctivae normal.  Neck:     Trachea: No tracheal deviation.  Cardiovascular:     Rate and Rhythm: Normal rate.     Pulses: Normal pulses.  Pulmonary:     Effort: Pulmonary effort is normal. No respiratory distress.  Abdominal:     General: There is no distension.     Tenderness: There is no abdominal tenderness.  Genitourinary:    Comments: No cva tenderness.  T/L/S spine nontender, aligned,  no step off. Tenderness at left sciatic notch. Straight leg raise pos. No LLE edema. LLE of normal color and warmth. Distal pulses palp.  No pain w passive rom at hip or ankle.  Musculoskeletal:        General: No swelling.     Cervical back: Neck supple. No muscular tenderness.  Skin:    General: Skin is warm and dry.     Findings: No rash.  Neurological:     Mental Status: She is alert.     Comments: Alert, speech normal. Motor/sens grossly intact bil, LLE stre 5/5. Sens grossly intact.   Psychiatric:        Mood and Affect: Mood normal.     ED Results / Procedures / Treatments   Labs (all labs ordered are listed, but only abnormal results are displayed) Labs Reviewed - No data to display  EKG None  Radiology No results found.  Procedures Procedures    Medications Ordered in ED Medications  HYDROmorphone (DILAUDID) injection 1 mg (has no administration in time range)    ED Course/ Medical Decision Making/ A&P                             Medical Decision Making Problems Addressed: DDD (degenerative disc disease), lumbar: chronic illness or injury with exacerbation, progression, or side effects of treatment that poses a threat to life or bodily functions Elevated blood pressure reading: acute illness or injury Essential hypertension: chronic illness or injury with exacerbation, progression, or side effects of treatment that poses a threat to life or bodily functions Left sided sciatica: acute illness or injury    Details: Acute/chronic  Amount and/or Complexity of Data Reviewed External Data Reviewed: radiology and notes.  Risk Prescription drug management. Parenteral controlled substances.   Reviewed nursing notes and prior charts for additional history. Pt with recent kenalog injection for same, therefore will hold off on additional steroid therapy for now.  Prior mri reviewed - lumbar ddd then. Discussed plan for pcp/spine speciality f/u.  Pt has ride, does  not have to drive. Dilaudid IM.  Initial bp high (take over sweatshirt). On recheck, DBP in 90s. Pt denies headache or neuro c/o, no cp or edema or sob. Pt indicates bp is higher than normal when having pain - indicates has adequate of her meds (hctz + amlodipine) at home, and did take this AM.   Rx for home for pain. Stressed need close pcp and spine f/u.   Return precautions provided.             Final Clinical Impression(s) / ED Diagnoses Final diagnoses:  None    Rx / DC Orders ED Discharge Orders     None  Lajean Saver, MD 04/11/22 [phone]

## 2022-04-11 NOTE — ED Triage Notes (Signed)
Pt reports a sciatic nerve flare-up on LT side that began a few days ago. Pt takes gabapentin and is not helping. Pt ambulated slowly to triage with cane. No other complaints.

## 2022-04-17 ENCOUNTER — Ambulatory Visit
Admission: RE | Admit: 2022-04-17 | Discharge: 2022-04-17 | Disposition: A | Discharge status: Home / Self Care | Payer: Self-pay | Source: Ambulatory Visit | Attending: Physician Assistant | Admitting: Physician Assistant

## 2022-04-17 DIAGNOSIS — M47816 Spondylosis without myelopathy or radiculopathy, lumbar region: Secondary | ICD-10-CM

## 2022-05-16 ENCOUNTER — Other Ambulatory Visit: Payer: Self-pay

## 2022-05-16 ENCOUNTER — Ambulatory Visit: Payer: No Typology Code available for payment source | Attending: Neurological Surgery

## 2022-05-16 DIAGNOSIS — M6281 Muscle weakness (generalized): Secondary | ICD-10-CM | POA: Diagnosis present

## 2022-05-16 DIAGNOSIS — M5459 Other low back pain: Secondary | ICD-10-CM | POA: Insufficient documentation

## 2022-05-16 DIAGNOSIS — R2689 Other abnormalities of gait and mobility: Secondary | ICD-10-CM | POA: Insufficient documentation

## 2022-05-16 NOTE — Therapy (Signed)
OUTPATIENT PHYSICAL THERAPY THORACOLUMBAR EVALUATION   Patient Name: [name] MRN: [id number] DOB:11/23/1970, 52 y.o., female Today's Date: 05/17/2022  END OF SESSION:  PT End of Session - 05/17/22 0741     Visit Number 1    Number of Visits 17    Date for PT Re-Evaluation 07/12/22    Authorization Type [employer]    PT Start Time 1700    PT Stop Time 1750    PT Time Calculation (min) 50 min    Activity Tolerance Patient tolerated treatment well    Behavior During Therapy WFL for tasks assessed/performed             Past Medical History:  Diagnosis Date   Diabetes mellitus without complication ([place])    Hypertension    Past Surgical History:  Procedure Laterality Date   ABDOMINAL HYSTERECTOMY     REPLACEMENT TOTAL KNEE BILATERAL     REPLACEMENT TOTAL KNEE BILATERAL     There are no problems to display for this patient.   PCP: Simona Huh, NP  REFERRING PROVIDER: Judith Part, MD  REFERRING DIAG: M54.16 (ICD-10-CM) - Radiculopathy, lumbar region   Rationale for Evaluation and Treatment: Rehabilitation  THERAPY DIAG:  Other low back pain - Plan: PT plan of care cert/re-cert  Muscle weakness (generalized) - Plan: PT plan of care cert/re-cert  Other abnormalities of gait and mobility - Plan: PT plan of care cert/re-cert  ONSET DATE: Chronic with acute exacerbation   SUBJECTIVE:                                                                                                                                                                                         SUBJECTIVE STATEMENT: Pt presents to PT with reports of chronic lower back pain with referral down lateral L LE. Notes recent exacerbation of pain with no MOI, has occasional N/T in bilateral feet. Pt states that work related duties with stair navigation increases her pain, she has been doing better over last few weeks as she has been working from home. Denies bowel/bladder changes or saddle  anesthesia. Feels very limited with community activities,<4mn of comfortable navigation and ambulation.   PERTINENT HISTORY:  HTN, DMII  PAIN:  Are you having pain?  Yes: NPRS scale: 8.5/10 Worst: 10/10 Pain location: lower back, lateral/posterior L LE Pain description: sharp Aggravating factors: prolonged sitting, stairs Relieving factors: medication, ice packs  PRECAUTIONS: None  WEIGHT BEARING RESTRICTIONS: No  FALLS:  Has patient fallen in last 6 months? 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 Compared with the previous MRI from 11/20/2019, there is a probable enlarging, non migrated disc extrusion in the left subarticular zone at L4-5 with associated left L5 nerve root encroachment. This finding is not well seen on the sagittal images and could be due to left L5 nerve root enlargement/edema. 2. Interval enlargement of a disc protrusion in the right subarticular zone at L5-S1 with resulting right S1 nerve root encroachment. Small synovial cyst peripherally in the left L5-S1 foramen without definite nerve root encroachment. 3. No other significant spinal stenosis or nerve root encroachment. Stable mild spondylosis at L2-3 and L3-4.  PATIENT SURVEYS:  FOTO: 36% function; 49% predicted   COGNITION: Overall cognitive status: Within functional limits for tasks assessed     SENSATION: Light touch: Impaired - L LE decreased  POSTURE: rounded shoulders, forward head, and increased lumbar lordosis  PALPATION: TTP to lumbar paraspinals, L gluteals  LUMBAR ROM:   AROM eval  Flexion  Decreased; with pain  Extension Severe pain  Right lateral flexion   Left lateral flexion   Right rotation   Left rotation    (Blank rows = not tested)  LOWER EXTREMITY MMT:    MMT Right eval Left eval  Hip flexion 3+/5 3/5  Hip extension    Hip abduction 3+/5 3/5  Hip adduction    Hip internal rotation    Hip external rotation    Knee flexion    Knee extension    Ankle dorsiflexion    Ankle plantarflexion    Ankle inversion    Ankle eversion     (Blank rows = not tested)  LUMBAR SPECIAL TESTS:  Straight leg raise test: Positive and Slump test: Positive  FUNCTIONAL TESTS:  30 Second Sit to Stand: 4 reps  GAIT: Distance walked: 50f Assistive device utilized: None Level of assistance: Complete Independence Comments: antalgic gait L LE  TREATMENT: [facility] Adult PT Treatment:                                                DATE: 05/16/2022 Therapeutic Exercise: Seated sciatic nerve glide x 5 L Seated clamshell x 10 GTB Supine PPT x 5 - 5" hold   PATIENT EDUCATION:  Education details: eval findings, FOTO, HEP, POC Person educated: Patient Education method: Explanation, Demonstration, and Handouts Education comprehension: verbalized understanding and returned demonstration  HOME EXERCISE PROGRAM: Access Code: [email or web address]: [email or web address] Date: 05/16/2022 Prepared by: DOctavio Manns Exercises - Seated Sciatic Tensioner  - 1 x daily - 7 x weekly - 2 sets - 10 reps - Seated Hip Abduction with Resistance  - 1 x daily - 7 x weekly - 3 sets - 10 reps - green hold - Supine Posterior Pelvic Tilt  - 1 x daily - 7 x weekly - 2 sets - 10 reps - 2 sec hold  ASSESSMENT:  CLINICAL IMPRESSION: Patient is a 52y.o. F who was seen today for physical therapy evaluation and treatment for chronic LBP with referral into L LE with acute exacerbation. Physical findings are consistent with MD impression as pt has positive provocation findings for neural tension and  palpable  increase in LBP. Her FOTO shows marked decrease in functional ability below PLOF. She would benefit from skilled PT working on decreasing pain by improving strength and mobility. May trial aquatic therapy if severe pain continues.   OBJECTIVE IMPAIRMENTS: Abnormal gait, decreased activity tolerance, decreased balance, decreased endurance, decreased mobility, difficulty walking, decreased ROM, decreased strength, and pain.   ACTIVITY LIMITATIONS: carrying, lifting, bending, sitting, standing, stairs, transfers, and locomotion level  PARTICIPATION LIMITATIONS: cleaning, driving, shopping, community activity, occupation, and yard work  PERSONAL FACTORS: Fitness, Time since onset of injury/illness/exacerbation, and 1-2 comorbidities: HTN, DMII  are also affecting patient's functional outcome.   REHAB POTENTIAL: Good  CLINICAL DECISION MAKING: Evolving/moderate complexity  EVALUATION COMPLEXITY: Moderate   GOALS: Goals reviewed with patient? No  SHORT TERM GOALS: Target date: 06/06/2022   Pt will be compliant and knowledgeable with initial HEP for improved comfort and carryover Baseline: initial HEP given  Goal status: INITIAL  2.  Pt will self report lower back pain no greater than 6/10 for improved comfort and functional ability Baseline: 10/10 at worst Goal status: INITIAL   LONG TERM GOALS: Target date: 07/11/2022   Pt will improve FOTO function score to no less than 49% as proxy for functional improvement Baseline: 36% function Goal status: INITIAL   2.  Pt will self report lower back pain no greater than 3/10 for improved comfort and functional ability Baseline: 10/10 at worst Goal status: INITIAL   3.  Pt will increase 30 Second Sit to Stand rep count to no less than 6 reps for improved balance, strength, and functional mobility Baseline: 4 reps (MCID 2 reps) Goal status: INITIAL   4.  Pt will be able to improve functional hip strength to no less than 4/5 for  improved comfort and functional mobility Baseline: see chart Goal status: INITIAL  5.  Pt will be able to perform >30 min of standing activity for improved activity tolerance with home and community activity Baseline: <10mn Goal status: INITIAL  PLAN:  PT FREQUENCY: 1-2x/week  PT DURATION: 8 weeks  PLANNED INTERVENTIONS: Therapeutic exercises, Therapeutic activity, Neuromuscular re-education, Balance training, Gait training, Patient/Family education, Self Care, Joint mobilization, Dry Needling, Electrical stimulation, Cryotherapy, Moist heat, Vasopneumatic device, Manual therapy, and Re-evaluation.  PLAN FOR NEXT SESSION: assess HEP response, progress core and proximal hip strength   DWard Chatters PT 05/17/2022, 7:59 AM

## 2022-05-30 ENCOUNTER — Ambulatory Visit: Payer: No Typology Code available for payment source

## 2022-05-30 DIAGNOSIS — M5459 Other low back pain: Secondary | ICD-10-CM | POA: Diagnosis not present

## 2022-05-30 DIAGNOSIS — M6281 Muscle weakness (generalized): Secondary | ICD-10-CM

## 2022-05-30 DIAGNOSIS — R2689 Other abnormalities of gait and mobility: Secondary | ICD-10-CM

## 2022-05-30 NOTE — Therapy (Signed)
OUTPATIENT PHYSICAL THERAPY TREATMENT NOTE   Patient Name: [name] MRN: [id number] DOB:March 16, 1970, 52 y.o., female Today's Date: 05/30/2022  PCP: Simona Huh, NP  REFERRING PROVIDER: Judith Part, MD   END OF SESSION:   PT End of Session - 05/30/22 0842     Visit Number 2    Number of Visits 17    Date for PT Re-Evaluation 07/12/22    Authorization Type [employer]    PT Start Time 0840   arrived late   PT Stop Time 0910    PT Time Calculation (min) 30 min    Activity Tolerance Patient tolerated treatment well    Behavior During Therapy WFL for tasks assessed/performed             Past Medical History:  Diagnosis Date   Diabetes mellitus without complication ([place])    Hypertension    Past Surgical History:  Procedure Laterality Date   ABDOMINAL HYSTERECTOMY     REPLACEMENT TOTAL KNEE BILATERAL     REPLACEMENT TOTAL KNEE BILATERAL     There are no problems to display for this patient.   REFERRING DIAG: M54.16 (ICD-10-CM) - Radiculopathy, lumbar region   THERAPY DIAG:  Other low back pain  Muscle weakness (generalized)  Other abnormalities of gait and mobility  Rationale for Evaluation and Treatment Rehabilitation  PERTINENT HISTORY: HTN, DMII   PRECAUTIONS: None   SUBJECTIVE:                                                                                                                                                                                      SUBJECTIVE STATEMENT:  Pt presents to PT with reports of continued LBP, with radicular symptoms down L LE. Has been compliant with HEP despite pain.    PAIN:  Are you having pain?  Yes: NPRS scale: 9/10 Worst: 10/10 Pain location: lower back, lateral/posterior L LE Pain description: sharp Aggravating factors: prolonged sitting, stairs Relieving factors: medication, ice packs   OBJECTIVE: (objective measures completed at initial evaluation unless otherwise dated)  DIAGNOSTIC FINDINGS:   IMPRESSION: 1. Compared with the previous MRI from 11/20/2019, there is a probable enlarging, non migrated disc extrusion in the left subarticular zone at L4-5 with associated left L5 nerve root encroachment. This finding is not well seen on the sagittal images and could be due to left L5 nerve root enlargement/edema. 2. Interval enlargement of a disc protrusion in the right subarticular zone at L5-S1 with resulting right S1 nerve root encroachment. Small synovial cyst peripherally in the left L5-S1 foramen without definite nerve root encroachment. 3. No other significant spinal stenosis or nerve root encroachment. Stable mild spondylosis at  L2-3 and L3-4.   PATIENT SURVEYS:  FOTO: 36% function; 49% predicted    COGNITION: Overall cognitive status: Within functional limits for tasks assessed                          SENSATION: Light touch: Impaired - L LE decreased   POSTURE: rounded shoulders, forward head, and increased lumbar lordosis   PALPATION: TTP to lumbar paraspinals, L gluteals   LUMBAR ROM:    AROM eval  Flexion Decreased; with pain  Extension Severe pain  Right lateral flexion    Left lateral flexion    Right rotation    Left rotation     (Blank rows = not tested)   LOWER EXTREMITY MMT:     MMT Right eval Left eval  Hip flexion 3+/5 3/5  Hip extension      Hip abduction 3+/5 3/5  Hip adduction      Hip internal rotation      Hip external rotation      Knee flexion      Knee extension      Ankle dorsiflexion      Ankle plantarflexion      Ankle inversion      Ankle eversion       (Blank rows = not tested)   LUMBAR SPECIAL TESTS:  Straight leg raise test: Positive and Slump test: Positive   FUNCTIONAL TESTS:  30 Second Sit to Stand: 4 reps   GAIT: Distance walked: 74ft Assistive device utilized: None Level of assistance: Complete Independence Comments: antalgic gait L LE   TREATMENT: [facility] Adult PT Treatment:                                                 DATE: 05/30/2022 Therapeutic Exercise: NuStep lvl 5 UE/LE x 4 min while taking subjective Seated ball squeeze 2x10 - 3" hold Seated clamshell 2x15 GTB Seated march 2x10 GTB LTR x 2 - unable to tolerate Pallof press 2x10 YTB Standing hip abd x 10 each Modalities: MHP to lumbar spine in sitting x 10 min post session  [facility] Adult PT Treatment:                                                DATE: 05/16/2022 Therapeutic Exercise: Seated sciatic nerve glide x 5 L Seated clamshell x 10 GTB Supine PPT x 5 - 5" hold    PATIENT EDUCATION:  Education details: eval findings, FOTO, HEP, POC Person educated: Patient Education method: Explanation, Demonstration, and Handouts Education comprehension: verbalized understanding and returned demonstration   HOME EXERCISE PROGRAM: Access Code: [email or web address] URL: [email or web address] Date: 05/16/2022 Prepared by: Octavio Manns   Exercises - Seated Sciatic Tensioner  - 1 x daily - 7 x weekly - 2 sets - 10 reps - Seated Hip Abduction with Resistance  - 1 x daily - 7 x weekly - 3 sets - 10 reps - green hold - Supine Posterior Pelvic Tilt  - 1 x daily - 7 x weekly - 2 sets - 10 reps - 2 sec hold   ASSESSMENT:   CLINICAL IMPRESSION: Pt tolerated treatment fair but continued to be limited by pain, especially with supine  exercises. Therapy focused on core and proximal hip strengthening for decreasing pain and improving function. Will continue per POC as prescribed.    OBJECTIVE IMPAIRMENTS: Abnormal gait, decreased activity tolerance, decreased balance, decreased endurance, decreased mobility, difficulty walking, decreased ROM, decreased strength, and pain.    ACTIVITY LIMITATIONS: carrying, lifting, bending, sitting, standing, stairs, transfers, and locomotion level   PARTICIPATION LIMITATIONS: cleaning, driving, shopping, community activity, occupation, and yard work   PERSONAL FACTORS: Fitness, Time since onset of  injury/illness/exacerbation, and 1-2 comorbidities: HTN, DMII  are also affecting patient's functional outcome.      GOALS: Goals reviewed with patient? No   SHORT TERM GOALS: Target date: 06/06/2022   Pt will be compliant and knowledgeable with initial HEP for improved comfort and carryover Baseline: initial HEP given  Goal status: INITIAL   2.  Pt will self report lower back pain no greater than 6/10 for improved comfort and functional ability Baseline: 10/10 at worst Goal status: INITIAL    LONG TERM GOALS: Target date: 07/11/2022   Pt will improve FOTO function score to no less than 49% as proxy for functional improvement Baseline: 36% function Goal status: INITIAL    2.  Pt will self report lower back pain no greater than 3/10 for improved comfort and functional ability Baseline: 10/10 at worst Goal status: INITIAL    3.  Pt will increase 30 Second Sit to Stand rep count to no less than 6 reps for improved balance, strength, and functional mobility Baseline: 4 reps (MCID 2 reps) Goal status: INITIAL    4.  Pt will be able to improve functional hip strength to no less than 4/5 for improved comfort and functional mobility Baseline: see chart Goal status: INITIAL   5.  Pt will be able to perform >30 min of standing activity for improved activity tolerance with home and community activity Baseline: <31min Goal status: INITIAL   PLAN:   PT FREQUENCY: 1-2x/week   PT DURATION: 8 weeks   PLANNED INTERVENTIONS: Therapeutic exercises, Therapeutic activity, Neuromuscular re-education, Balance training, Gait training, Patient/Family education, Self Care, Joint mobilization, Dry Needling, Electrical stimulation, Cryotherapy, Moist heat, Vasopneumatic device, Manual therapy, and Re-evaluation.   PLAN FOR NEXT SESSION: assess HEP response, progress core and proximal hip strength   Ward Chatters, PT 05/30/2022, 10:43 AM

## 2022-06-02 ENCOUNTER — Ambulatory Visit: Payer: No Typology Code available for payment source

## 2022-06-02 DIAGNOSIS — R2689 Other abnormalities of gait and mobility: Secondary | ICD-10-CM

## 2022-06-02 DIAGNOSIS — M6281 Muscle weakness (generalized): Secondary | ICD-10-CM

## 2022-06-02 DIAGNOSIS — M5459 Other low back pain: Secondary | ICD-10-CM | POA: Diagnosis not present

## 2022-06-02 NOTE — Therapy (Signed)
OUTPATIENT PHYSICAL THERAPY TREATMENT NOTE   Patient Name: [name] MRN: [id number] DOB:1970/06/12, 52 y.o., female Today's Date: 06/02/2022  PCP: Simona Huh, NP  REFERRING PROVIDER: Judith Part, MD   END OF SESSION:   PT End of Session - 06/02/22 1618     Visit Number 3    Number of Visits 17    Date for PT Re-Evaluation 07/12/22    Authorization Type [employer]    PT Start Time 1618    PT Stop Time 1656    PT Time Calculation (min) 38 min    Activity Tolerance Patient tolerated treatment well    Behavior During Therapy WFL for tasks assessed/performed              Past Medical History:  Diagnosis Date   Diabetes mellitus without complication ([place])    Hypertension    Past Surgical History:  Procedure Laterality Date   ABDOMINAL HYSTERECTOMY     REPLACEMENT TOTAL KNEE BILATERAL     REPLACEMENT TOTAL KNEE BILATERAL     There are no problems to display for this patient.   REFERRING DIAG: M54.16 (ICD-10-CM) - Radiculopathy, lumbar region   THERAPY DIAG:  Other low back pain  Muscle weakness (generalized)  Other abnormalities of gait and mobility  Rationale for Evaluation and Treatment Rehabilitation  PERTINENT HISTORY: HTN, DMII   PRECAUTIONS: None   SUBJECTIVE:                                                                                                                                                                                      SUBJECTIVE STATEMENT:  Pt presents to PT with reports of continued severe pain. Has tried to be compliant with HEP.    PAIN:  Are you having pain?  Yes: NPRS scale: 9/10 Worst: 10/10 Pain location: lower back, lateral/posterior L LE Pain description: sharp Aggravating factors: prolonged sitting, stairs Relieving factors: medication, ice packs   OBJECTIVE: (objective measures completed at initial evaluation unless otherwise dated)  DIAGNOSTIC FINDINGS:  IMPRESSION: 1. Compared with the previous MRI  from 11/20/2019, there is a probable enlarging, non migrated disc extrusion in the left subarticular zone at L4-5 with associated left L5 nerve root encroachment. This finding is not well seen on the sagittal images and could be due to left L5 nerve root enlargement/edema. 2. Interval enlargement of a disc protrusion in the right subarticular zone at L5-S1 with resulting right S1 nerve root encroachment. Small synovial cyst peripherally in the left L5-S1 foramen without definite nerve root encroachment. 3. No other significant spinal stenosis or nerve root encroachment. Stable mild spondylosis at L2-3 and L3-4.   PATIENT SURVEYS:  FOTO: 36% function; 49% predicted    COGNITION: Overall cognitive status: Within functional limits for tasks assessed                          SENSATION: Light touch: Impaired - L LE decreased   POSTURE: rounded shoulders, forward head, and increased lumbar lordosis   PALPATION: TTP to lumbar paraspinals, L gluteals   LUMBAR ROM:    AROM eval  Flexion Decreased; with pain  Extension Severe pain  Right lateral flexion    Left lateral flexion    Right rotation    Left rotation     (Blank rows = not tested)   LOWER EXTREMITY MMT:     MMT Right eval Left eval  Hip flexion 3+/5 3/5  Hip extension      Hip abduction 3+/5 3/5  Hip adduction      Hip internal rotation      Hip external rotation      Knee flexion      Knee extension      Ankle dorsiflexion      Ankle plantarflexion      Ankle inversion      Ankle eversion       (Blank rows = not tested)   LUMBAR SPECIAL TESTS:  Straight leg raise test: Positive and Slump test: Positive   FUNCTIONAL TESTS:  30 Second Sit to Stand: 4 reps   GAIT: Distance walked: 22ft Assistive device utilized: None Level of assistance: Complete Independence Comments: antalgic gait L LE   TREATMENT: [facility] Adult PT Treatment:                                                DATE: 06/02/2022 Therapeutic  Exercise: NuStep lvl 5 UE/LE x 4 min while taking subjective Seated ball squeeze 2x10 - 3" hold Seated clamshell 2x15 RTB Seated alt pilates march with ring 2x10 each Seated pilates ring squeeze with FAQ x 10 Modalities: MHP to lumbar spine in sitting during seated therex  [facility] Adult PT Treatment:                                                DATE: 05/30/2022 Therapeutic Exercise: NuStep lvl 5 UE/LE x 4 min while taking subjective Seated ball squeeze 2x10 - 3" hold Seated clamshell 2x15 GTB Seated march 2x10 GTB LTR x 2 - unable to tolerate Pallof press 2x10 YTB Standing hip abd x 10 each Modalities: MHP to lumbar spine in sitting x 10 min post session  [facility] Adult PT Treatment:                                                DATE: 05/16/2022 Therapeutic Exercise: Seated sciatic nerve glide x 5 L Seated clamshell x 10 GTB Supine PPT x 5 - 5" hold    PATIENT EDUCATION:  Education details: eval findings, FOTO, HEP, POC Person educated: Patient Education method: Explanation, Demonstration, and Handouts Education comprehension: verbalized understanding and returned demonstration   HOME EXERCISE PROGRAM: Access Code: [email or web address] URL: [email or web address] Date: 05/16/2022  Prepared by: Octavio Manns   Exercises - Seated Sciatic Tensioner  - 1 x daily - 7 x weekly - 2 sets - 10 reps - Seated Hip Abduction with Resistance  - 1 x daily - 7 x weekly - 3 sets - 10 reps - green hold - Supine Posterior Pelvic Tilt  - 1 x daily - 7 x weekly - 2 sets - 10 reps - 2 sec hold   ASSESSMENT:   CLINICAL IMPRESSION: Pt tolerated treatment fair but continued to be limited by pain, especially with supine exercises. Therapy focused on core and proximal hip strengthening for decreasing pain and improving function. Questionable therapeutic benefit if pain continues to this level. Will continue per POC as prescribed.    OBJECTIVE IMPAIRMENTS: Abnormal gait, decreased activity tolerance,  decreased balance, decreased endurance, decreased mobility, difficulty walking, decreased ROM, decreased strength, and pain.    ACTIVITY LIMITATIONS: carrying, lifting, bending, sitting, standing, stairs, transfers, and locomotion level   PARTICIPATION LIMITATIONS: cleaning, driving, shopping, community activity, occupation, and yard work   PERSONAL FACTORS: Fitness, Time since onset of injury/illness/exacerbation, and 1-2 comorbidities: HTN, DMII  are also affecting patient's functional outcome.      GOALS: Goals reviewed with patient? No   SHORT TERM GOALS: Target date: 06/06/2022   Pt will be compliant and knowledgeable with initial HEP for improved comfort and carryover Baseline: initial HEP given  Goal status: INITIAL   2.  Pt will self report lower back pain no greater than 6/10 for improved comfort and functional ability Baseline: 10/10 at worst Goal status: INITIAL    LONG TERM GOALS: Target date: 07/11/2022   Pt will improve FOTO function score to no less than 49% as proxy for functional improvement Baseline: 36% function Goal status: INITIAL    2.  Pt will self report lower back pain no greater than 3/10 for improved comfort and functional ability Baseline: 10/10 at worst Goal status: INITIAL    3.  Pt will increase 30 Second Sit to Stand rep count to no less than 6 reps for improved balance, strength, and functional mobility Baseline: 4 reps (MCID 2 reps) Goal status: INITIAL    4.  Pt will be able to improve functional hip strength to no less than 4/5 for improved comfort and functional mobility Baseline: see chart Goal status: INITIAL   5.  Pt will be able to perform >30 min of standing activity for improved activity tolerance with home and community activity Baseline: <46min Goal status: INITIAL   PLAN:   PT FREQUENCY: 1-2x/week   PT DURATION: 8 weeks   PLANNED INTERVENTIONS: Therapeutic exercises, Therapeutic activity, Neuromuscular re-education, Balance  training, Gait training, Patient/Family education, Self Care, Joint mobilization, Dry Needling, Electrical stimulation, Cryotherapy, Moist heat, Vasopneumatic device, Manual therapy, and Re-evaluation.   PLAN FOR NEXT SESSION: assess HEP response, progress core and proximal hip strength   Ward Chatters, PT 06/02/2022, 4:59 PM

## 2022-06-06 ENCOUNTER — Ambulatory Visit: Payer: No Typology Code available for payment source

## 2022-06-06 DIAGNOSIS — M5459 Other low back pain: Secondary | ICD-10-CM

## 2022-06-06 DIAGNOSIS — R2689 Other abnormalities of gait and mobility: Secondary | ICD-10-CM

## 2022-06-06 DIAGNOSIS — M6281 Muscle weakness (generalized): Secondary | ICD-10-CM

## 2022-06-06 NOTE — Therapy (Addendum)
OUTPATIENT PHYSICAL THERAPY TREATMENT NOTE/DISCHARGE  PHYSICAL THERAPY DISCHARGE SUMMARY  Visits from Start of Care: 4  Current functional level related to goals / functional outcomes: See objective and assessment   Remaining deficits: See objective and assessment   Education / Equipment: HEP   Patient agrees to discharge. Patient goals were not met. Patient is being discharged due to did not respond to therapy.    Patient Name: [name] MRN: [id number] DOB:06/23/1970, 52 y.o., , female Today's Date: 06/07/2022  PCP: Courtney Paris, NP  REFERRING PROVIDER: Jadene Pierini, MD   END OF SESSION:   PT End of Session - 06/06/22 1658     Visit Number 4    Number of Visits 17    Date for PT Re-Evaluation 07/12/22    Authorization Type [employer]et[employer]    PT Start Time 1700    PT Stop Time 1715    PT Time Calculation (min) 15 min    Activity Tolerance Patient tolerated treatment well    Behavior During Therapy WFL for tasks assessed/performed               Past Medical History:  Diagnosis Date   Diabetes mellitus without complication ([facility])    Hypertension    Past Surgical History:  Procedure Laterality Date   ABDOMINAL HYSTERECTOMY     REPLACEMENT TOTAL KNEE BILATERAL     REPLACEMENT TOTAL KNEE BILATERAL     There are no problems to display for this patient.   REFERRING DIAG: M54.16 (ICD-10-CM) - Radiculopathy, lumbar region   THERAPY DIAG:  Other low back pain  Muscle weakness (generalized)  Other abnormalities of gait and mobility  Rationale for Evaluation and Treatment Rehabilitation  PERTINENT HISTORY: HTN, DMII   PRECAUTIONS: None   SUBJECTIVE:                                                                                                                                                                                      SUBJECTIVE STATEMENT:  Pt presents to PT with reports of increased pain and discomfort. She has called and made an appointment  with Dr. Maurice Small next week, believes this should be her last session with PT based on increasing pain.    PAIN:  Are you having pain?  Yes: NPRS scale: 9/10 Worst: 10/10 Pain location: lower back, lateral/posterior L LE Pain description: sharp Aggravating factors: prolonged sitting, stairs Relieving factors: medication, ice packs   OBJECTIVE: (objective measures completed at initial evaluation unless otherwise dated)  DIAGNOSTIC FINDINGS:  IMPRESSION: 1. Compared with the previous MRI from 11/20/2019, there is a probable enlarging, non migrated disc extrusion in the left subarticular zone at L4-5  with associated left L5 nerve root encroachment. This finding is not well seen on the sagittal images and could be due to left L5 nerve root enlargement/edema. 2. Interval enlargement of a disc protrusion in the right subarticular zone at L5-S1 with resulting right S1 nerve root encroachment. Small synovial cyst peripherally in the left L5-S1 foramen without definite nerve root encroachment. 3. No other significant spinal stenosis or nerve root encroachment. Stable mild spondylosis at L2-3 and L3-4.   PATIENT SURVEYS:  FOTO: 30% function; 49% predicted - 06/06/2022   COGNITION: Overall cognitive status: Within functional limits for tasks assessed                          SENSATION: Light touch: Impaired - L LE decreased   POSTURE: rounded shoulders, forward head, and increased lumbar lordosis   PALPATION: TTP to lumbar paraspinals, L gluteals   LUMBAR ROM:    AROM eval  Flexion Decreased; with pain  Extension Severe pain  Right lateral flexion    Left lateral flexion    Right rotation    Left rotation     (Blank rows = not tested)   LOWER EXTREMITY MMT:     MMT Right eval Left eval Right 06/06/2022 Left 06/06/2022  Hip flexion 3+/5 3/5 3/5 3/5  Hip extension        Hip abduction 3+/5 3/5 3/5 3/5  Hip adduction        Hip internal rotation        Hip external  rotation        Knee flexion        Knee extension        Ankle dorsiflexion        Ankle plantarflexion        Ankle inversion        Ankle eversion         (Blank rows = not tested)   LUMBAR SPECIAL TESTS:  Straight leg raise test: Positive and Slump test: Positive   FUNCTIONAL TESTS:  30 Second Sit to Stand: 4 reps   GAIT: Distance walked: 75ft Assistive device utilized: None Level of assistance: Complete Independence Comments: antalgic gait L LE   TREATMENT: [facility] Adult PT Treatment:                                                DATE: 06/06/2022 Therapeutic Activity: Assessment of tests/measures, goals, and outcomes for discharge  [facility] Adult PT Treatment:                                                DATE: 06/02/2022 Therapeutic Exercise: NuStep lvl 5 UE/LE x 4 min while taking subjective Seated ball squeeze 2x10 - 3" hold Seated clamshell 2x15 RTB Seated alt pilates march with ring 2x10 each Seated pilates ring squeeze with FAQ x 10 Modalities: MHP to lumbar spine in sitting during seated therex  [facility] Adult PT Treatment:  DATE: 05/30/2022 Therapeutic Exercise: NuStep lvl 5 UE/LE x 4 min while taking subjective Seated ball squeeze 2x10 - 3" hold Seated clamshell 2x15 GTB Seated march 2x10 GTB LTR x 2 - unable to tolerate Pallof press 2x10 YTB Standing hip abd x 10 each Modalities: MHP to lumbar spine in sitting x 10 min post session  [facility] Adult PT Treatment:                                                DATE: 05/16/2022 Therapeutic Exercise: Seated sciatic nerve glide x 5 L Seated clamshell x 10 GTB Supine PPT x 5 - 5" hold    PATIENT EDUCATION:  Education details: eval findings, FOTO, HEP, POC Person educated: Patient Education method: Explanation, Demonstration, and Handouts Education comprehension: verbalized understanding and returned demonstration   HOME EXERCISE PROGRAM: Access Code: [email or web address] URL:  [email or web address] Date: 05/16/2022 Prepared by: Edwinna Areola   Exercises - Seated Sciatic Tensioner  - 1 x daily - 7 x weekly - 2 sets - 10 reps - Seated Hip Abduction with Resistance  - 1 x daily - 7 x weekly - 3 sets - 10 reps - green hold - Supine Posterior Pelvic Tilt  - 1 x daily - 7 x weekly - 2 sets - 10 reps - 2 sec hold   ASSESSMENT:   CLINICAL IMPRESSION: Pt arrived to PT with reports of severe pain that has only been increasing since starting therapy. Today all of her subjective and objective measures showed worsening symptoms matching her reports of continued pain increase. She has scheduled a follow up with MD and would like to hold/discharge from therapy at this time.    OBJECTIVE IMPAIRMENTS: Abnormal gait, decreased activity tolerance, decreased balance, decreased endurance, decreased mobility, difficulty walking, decreased ROM, decreased strength, and pain.    ACTIVITY LIMITATIONS: carrying, lifting, bending, sitting, standing, stairs, transfers, and locomotion level   PARTICIPATION LIMITATIONS: cleaning, driving, shopping, community activity, occupation, and yard work   PERSONAL FACTORS: Fitness, Time since onset of injury/illness/exacerbation, and 1-2 comorbidities: HTN, DMII  are also affecting patient's functional outcome.      GOALS: Goals reviewed with patient? No   SHORT TERM GOALS: Target date: 06/06/2022   Pt will be compliant and knowledgeable with initial HEP for improved comfort and carryover Baseline: initial HEP given  Goal status: MET   2.  Pt will self report lower back pain no greater than 6/10 for improved comfort and functional ability Baseline: 10/10 at worst Goal status: NOT MET   LONG TERM GOALS: Target date: 07/11/2022   Pt will improve FOTO function score to no less than 49% as proxy for functional improvement Baseline: 36% function 06/06/2022: 30% function Goal status: NOT MET    2.  Pt will self report lower back  pain no greater than 3/10 for improved comfort and functional ability Baseline: 10/10 at worst Goal status: NOT MET    3.  Pt will increase 30 Second Sit to Stand rep count to no less than 6 reps for improved balance, strength, and functional mobility Baseline: 4 reps (MCID 2 reps) 06/06/2022: 3 reps - with UE Goal status: NOT MET   4.  Pt will be able to improve functional hip strength to no less than 4/5 for improved comfort and functional mobility Baseline: see chart Goal status: NOT MET   5.  Pt will be able to perform >30 min of standing activity for improved activity tolerance with home and community activity Baseline: <30min 06/06/2022: no change Goal status: NOT MET   PLAN:   PT FREQUENCY: 1-2x/week   PT DURATION: 8 weeks   PLANNED INTERVENTIONS: Therapeutic exercises, Therapeutic activity, Neuromuscular re-education, Balance training, Gait training, Patient/Family education, Self Care, Joint mobilization, Dry Needling, Electrical stimulation, Cryotherapy, Moist heat, Vasopneumatic device, Manual therapy, and Re-evaluation.   PLAN FOR NEXT SESSION: assess HEP response, progress core and proximal hip strength   Eloy End, PT 06/07/2022, 8:40 AM

## 2022-06-08 ENCOUNTER — Ambulatory Visit: Payer: No Typology Code available for payment source

## 2022-06-13 ENCOUNTER — Ambulatory Visit: Payer: No Typology Code available for payment source

## 2023-02-04 ENCOUNTER — Other Ambulatory Visit: Payer: Self-pay

## 2023-02-04 ENCOUNTER — Encounter (HOSPITAL_COMMUNITY): Payer: Self-pay | Admitting: Emergency Medicine

## 2023-02-04 ENCOUNTER — Emergency Department (HOSPITAL_COMMUNITY)
Admission: EM | Admit: 2023-02-04 | Discharge: 2023-02-04 | Disposition: A | Discharge status: Home / Self Care | Payer: Commercial Managed Care - HMO | Attending: Emergency Medicine | Admitting: Emergency Medicine

## 2023-02-04 ENCOUNTER — Emergency Department (HOSPITAL_COMMUNITY): Payer: Commercial Managed Care - HMO

## 2023-02-04 DIAGNOSIS — Z79899 Other long term (current) drug therapy: Secondary | ICD-10-CM | POA: Diagnosis not present

## 2023-02-04 DIAGNOSIS — I10 Essential (primary) hypertension: Secondary | ICD-10-CM | POA: Diagnosis not present

## 2023-02-04 DIAGNOSIS — R319 Hematuria, unspecified: Secondary | ICD-10-CM | POA: Insufficient documentation

## 2023-02-04 DIAGNOSIS — T8189XA Other complications of procedures, not elsewhere classified, initial encounter: Secondary | ICD-10-CM | POA: Insufficient documentation

## 2023-02-04 DIAGNOSIS — Z6841 Body Mass Index (BMI) 40.0 and over, adult: Secondary | ICD-10-CM | POA: Insufficient documentation

## 2023-02-04 DIAGNOSIS — E669 Obesity, unspecified: Secondary | ICD-10-CM | POA: Diagnosis not present

## 2023-02-04 DIAGNOSIS — E119 Type 2 diabetes mellitus without complications: Secondary | ICD-10-CM | POA: Diagnosis not present

## 2023-02-04 DIAGNOSIS — Y732 Prosthetic and other implants, materials and accessory gastroenterology and urology devices associated with adverse incidents: Secondary | ICD-10-CM | POA: Insufficient documentation

## 2023-02-04 DIAGNOSIS — Z7984 Long term (current) use of oral hypoglycemic drugs: Secondary | ICD-10-CM | POA: Insufficient documentation

## 2023-02-04 DIAGNOSIS — D72829 Elevated white blood cell count, unspecified: Secondary | ICD-10-CM | POA: Diagnosis not present

## 2023-02-04 DIAGNOSIS — T148XXA Other injury of unspecified body region, initial encounter: Secondary | ICD-10-CM

## 2023-02-04 LAB — COMPREHENSIVE METABOLIC PANEL
ALT: 27 U/L (ref 0–44)
AST: 21 U/L (ref 15–41)
Albumin: 3.7 g/dL (ref 3.5–5.0)
Alkaline Phosphatase: 91 U/L (ref 38–126)
Anion gap: 9 (ref 5–15)
BUN: 17 mg/dL (ref 6–20)
CO2: 26 mmol/L (ref 22–32)
Calcium: 9.4 mg/dL (ref 8.9–10.3)
Chloride: 98 mmol/L (ref 98–111)
Creatinine, Ser: 0.69 mg/dL (ref 0.44–1.00)
GFR, Estimated: >60 mL/min (ref >60)
Glucose, Bld: 143 mg/dL — ABNORMAL HIGH (ref 70–99)
Potassium: 4.1 mmol/L (ref 3.5–5.1)
Sodium: 133 mmol/L — ABNORMAL LOW (ref 135–145)
Total Bilirubin: 0.9 mg/dL (ref <1.2)
Total Protein: 7.5 g/dL (ref 6.5–8.1)

## 2023-02-04 LAB — CBC WITH DIFFERENTIAL/PLATELET
Abs Immature Granulocytes: 0.06 10*3/uL (ref 0.00–0.07)
Basophils Absolute: 0 10*3/uL (ref 0.0–0.1)
Basophils Relative: 0 %
Eosinophils Absolute: 0.2 10*3/uL (ref 0.0–0.5)
Eosinophils Relative: 2 %
HCT: 28.9 % — ABNORMAL LOW (ref 36.0–46.0)
Hemoglobin: 9.2 g/dL — ABNORMAL LOW (ref 12.0–15.0)
Immature Granulocytes: 1 %
Lymphocytes Relative: 23 %
Lymphs Abs: 2.8 10*3/uL (ref 0.7–4.0)
MCH: 32.2 pg (ref 26.0–34.0)
MCHC: 31.8 g/dL (ref 30.0–36.0)
MCV: 101 fL — ABNORMAL HIGH (ref 80.0–100.0)
Monocytes Absolute: 0.7 10*3/uL (ref 0.1–1.0)
Monocytes Relative: 6 %
Neutro Abs: 8.7 10*3/uL — ABNORMAL HIGH (ref 1.7–7.7)
Neutrophils Relative %: 68 %
Platelets: 563 10*3/uL — ABNORMAL HIGH (ref 150–400)
RBC: 2.86 MIL/uL — ABNORMAL LOW (ref 3.87–5.11)
RDW: 14.6 % (ref 11.5–15.5)
WBC: 12.5 10*3/uL — ABNORMAL HIGH (ref 4.0–10.5)
nRBC: 0 % (ref 0.0–0.2)

## 2023-02-04 LAB — I-STAT CHEM 8, ED
BUN: 17 mg/dL (ref 6–20)
Calcium, Ion: 1.2 mmol/L (ref 1.15–1.40)
Chloride: 100 mmol/L (ref 98–111)
Creatinine, Ser: 0.8 mg/dL (ref 0.44–1.00)
Glucose, Bld: 141 mg/dL — ABNORMAL HIGH (ref 70–99)
HCT: 28 % — ABNORMAL LOW (ref 36.0–46.0)
Hemoglobin: 9.5 g/dL — ABNORMAL LOW (ref 12.0–15.0)
Potassium: 4.3 mmol/L (ref 3.5–5.1)
Sodium: 137 mmol/L (ref 135–145)
TCO2: 27 mmol/L (ref 22–32)

## 2023-02-04 LAB — CBG MONITORING, ED: Glucose-Capillary: 128 mg/dL — ABNORMAL HIGH (ref 70–99)

## 2023-02-04 LAB — I-STAT CG4 LACTIC ACID, ED: Lactic Acid, Venous: 1.1 mmol/L (ref 0.5–1.9)

## 2023-02-04 MED ORDER — IOHEXOL 300 MG/ML  SOLN
100.0000 mL | Freq: Once | INTRAMUSCULAR | Status: AC | PRN
  Administered 2023-02-04: 100 mL via INTRAVENOUS

## 2023-02-04 MED ORDER — HYDROMORPHONE HCL 1 MG/ML IJ SOLN
0.5000 mg | Freq: Once | INTRAMUSCULAR | Status: AC
  Administered 2023-02-04: 0.5 mg via INTRAVENOUS

## 2023-02-04 MED ORDER — HYDROMORPHONE HCL 1 MG/ML IJ SOLN
0.5000 mg | INTRAMUSCULAR | Status: DC | PRN
  Administered 2023-02-04 (×2): 0.5 mg via INTRAVENOUS
  Filled 2023-02-04 (×2): qty 1

## 2023-02-04 NOTE — Discharge Instructions (Addendum)
Thank you for allowing [place] to take care of you today.  We hope you begin feeling better soon.  To-Do: Please follow-up with your surgeon. Please return to the Emergency Department or call 911 if you experience chest pain, shortness of breath, severe pain, severe fever, altered mental status, or have any reason to think that you need emergency medical care.  Thank you again.  Hope you feel better soon.  Department of Emergency Medicine [facility]

## 2023-02-04 NOTE — ED Provider Notes (Signed)
[place] EMERGENCY DEPARTMENT AT [facility] Provider Note  CSN: [id number] Arrival date & time: 02/04/23 4098  Chief Complaint(s) Post-op Problem  HPI [name] is a 52 y.o. female with a past medical history listed below who is postop day 13 after periprosthetic femur fracture following a fall while in [place].  Surgery was performed in [place] and patient was discharged 2 days ago with home PT.  Patient has had mild bloody drainage from the large lateral surgical incision but this became more significant today prompting her visit.  Patient reports baseline pain since surgery controlled with prescription medication.  She denies any fevers or chills.  She denies any additional falls or trauma.  HPI  Past Medical History Past Medical History:  Diagnosis Date   Diabetes mellitus without complication (HCC)    Hypertension    There are no problems to display for this patient.  Home Medication(s) Prior to Admission medications   Medication Sig Start Date End Date Taking? Authorizing Provider  albuterol (VENTOLIN HFA) 108 (90 Base) MCG/ACT inhaler Inhale 2 puffs into the lungs every 6 (six) hours as needed for wheezing or shortness of breath. 09/23/21 11/04/23 Yes [provider]  amLODipine (NORVASC) 10 MG tablet Take 1 tablet by mouth daily. 10/14/21  Yes [provider]  celecoxib (CELEBREX) 100 MG capsule Take 100 mg by mouth 2 (two) times daily.   Yes [provider]  gabapentin (NEURONTIN) 600 MG tablet Take 600 mg by mouth 2 (two) times daily.   Yes [provider]  glipiZIDE (GLUCOTROL) 5 MG tablet Take 1 tablet by mouth 2 (two) times daily. 11/16/22  Yes [provider]  hydrochlorothiazide (HYDRODIURIL) 50 MG tablet Take 50 mg by mouth daily.   Yes [provider]  metoprolol succinate (TOPROL-XL) 100 MG 24 hr tablet Take 100 mg by mouth daily. 04/08/21  Yes [provider]  montelukast (SINGULAIR) 10 MG  tablet Take 10 mg by mouth daily. 11/04/22  Yes [provider]  naloxone (NARCAN) nasal spray 4 mg/0.1 mL Place 1 spray into the nose once. 06/24/20  Yes [provider]  ondansetron (ZOFRAN) 4 MG tablet Take 4 mg by mouth every 8 (eight) hours as needed for nausea or vomiting. 01/30/23 02/06/23 Yes [provider]  oxyCODONE-acetaminophen (PERCOCET) 5-325 MG tablet Take 1-2 tablets by mouth every 6 (six) hours as needed. 10/22/19  Yes Delo, Riley Lam, MD  promethazine (PHENERGAN) 25 MG tablet Take 25 mg by mouth every 6 (six) hours as needed for nausea or vomiting.   Yes [provider]  rosuvastatin (CRESTOR) 40 MG tablet Take 40 mg by mouth every evening. 11/07/22  Yes [provider]  telmisartan (MICARDIS) 80 MG tablet Take 1 tablet by mouth daily. 04/05/21  Yes [provider]  tiZANidine (ZANAFLEX) 4 MG tablet Take 4 mg by mouth every 8 (eight) hours as needed for muscle spasms. 10/03/21  Yes [provider]  TRULICITY 0.75 MG/0.5ML SOAJ Inject 0.75 mg into the skin once a week. 01/20/23  Yes [provider]  zolpidem (AMBIEN) 10 MG tablet Take 10 mg by mouth at bedtime. 04/19/21  Yes [provider]  gabapentin (NEURONTIN) 300 MG capsule Take 1 capsule (300 mg total) by mouth 3 (three) times daily. Patient not taking: Reported on 02/04/2023 12/25/17   Horton, Mayer Masker, MD  hydrochlorothiazide (HYDRODIURIL) 25 MG tablet Take 1 tablet (25 mg total) by mouth daily. Patient not taking: Reported on 02/04/2023 11/25/17   Deretha Emory,  Lorin Picket, MD  ibuprofen (ADVIL,MOTRIN) 600 MG tablet Take 1 tablet (600 mg total) by mouth every 6 (six) hours as needed. Patient not taking: Reported on 02/04/2023 12/25/17   Horton, Mayer Masker, MD  ibuprofen (ADVIL,MOTRIN) 800 MG tablet Take 1 tablet (800 mg total) by mouth 3 (three) times daily. Patient not taking: Reported on 02/04/2023 11/25/17   Vanetta Mulders, MD  lidocaine (LIDODERM) 5 %  Place 1 patch onto the skin daily. Remove & Discard patch within 12 hours or as directed by MD Patient not taking: Reported on 02/04/2023 11/22/18   Alvira Monday, MD  methocarbamol (ROBAXIN) 750 MG tablet Take 1 tablet (750 mg total) by mouth 3 (three) times daily as needed (muscle spasm/pain). Patient not taking: Reported on 02/04/2023 04/11/22   Cathren Laine, MD  oxyCODONE (ROXICODONE) 5 MG immediate release tablet Take 1 tablet (5 mg total) by mouth every 4 (four) hours as needed for severe pain. Patient not taking: Reported on 02/04/2023 11/22/18   Alvira Monday, MD  predniSONE (DELTASONE) 10 MG tablet Take 2 tablets (20 mg total) by mouth 2 (two) times daily with a meal. Patient not taking: Reported on 02/04/2023 10/22/19   Geoffery Lyons, MD                                                                                                                                    Allergies Naproxen  Review of Systems Review of Systems As noted in HPI  Physical Exam Vital Signs  I have reviewed the triage vital signs BP (!) 149/89   Pulse 92   Temp 98.7 F (37.1 C) (Oral)   Resp 14   Ht 5\' 8"  (1.727 m)   Wt 129.3 kg   SpO2 96%   BMI 43.33 kg/m   Physical Exam Vitals reviewed.  Constitutional:      General: She is not in acute distress.    Appearance: She is well-developed. She is obese. She is not diaphoretic.  HENT:     Head: Normocephalic and atraumatic.     Right Ear: External ear normal.     Left Ear: External ear normal.     Nose: Nose normal.  Eyes:     General: No scleral icterus.    Conjunctiva/sclera: Conjunctivae normal.  Neck:     Trachea: Phonation normal.  Cardiovascular:     Rate and Rhythm: Normal rate and regular rhythm.  Pulmonary:     Effort: Pulmonary effort is normal. No respiratory distress.     Breath sounds: No stridor.  Abdominal:     General: There is no distension.  Musculoskeletal:        General: Normal range of motion.     Cervical  back: Normal range of motion.     Left upper leg: Swelling, edema and tenderness present.       Legs:     Comments: See image  Neurological:  Mental Status: She is alert and oriented to person, place, and time.  Psychiatric:        Behavior: Behavior normal.     ED Results and Treatments Labs (all labs ordered are listed, but only abnormal results are displayed) Labs Reviewed  CBC WITH DIFFERENTIAL/PLATELET - Abnormal; Notable for the following components:      Result Value   WBC 12.5 (*)    RBC 2.86 (*)    Hemoglobin 9.2 (*)    HCT 28.9 (*)    MCV 101.0 (*)    Platelets 563 (*)    Neutro Abs 8.7 (*)    All other components within normal limits  COMPREHENSIVE METABOLIC PANEL - Abnormal; Notable for the following components:   Sodium 133 (*)    Glucose, Bld 143 (*)    All other components within normal limits  I-STAT CHEM 8, ED - Abnormal; Notable for the following components:   Glucose, Bld 141 (*)    Hemoglobin 9.5 (*)    HCT 28.0 (*)    All other components within normal limits  CBG MONITORING, ED - Abnormal; Notable for the following components:   Glucose-Capillary 128 (*)    All other components within normal limits  I-STAT CG4 LACTIC ACID, ED  I-STAT CG4 LACTIC ACID, ED                                                                                                                         EKG  EKG Interpretation Date/Time:    Ventricular Rate:    PR Interval:    QRS Duration:    QT Interval:    QTC Calculation:   R Axis:      Text Interpretation:         Radiology CT FEMUR LEFT W CONTRAST  Result Date: 02/04/2023 CLINICAL DATA:  Left thigh postoperative infection EXAM: CT OF THE LOWER LEFT EXTREMITY WITH CONTRAST TECHNIQUE: Multidetector CT imaging of the lower left extremity was performed according to the standard protocol following intravenous contrast administration. RADIATION DOSE REDUCTION: This exam was performed according to the departmental  dose-optimization program which includes automated exposure control, adjustment of the mA and/or kV according to patient size and/or use of iterative reconstruction technique. CONTRAST:  OMNIPAQUE IOHEXOL 300 MG/ML  SOLN COMPARISON:  None Available. 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 Comminuted distal femoral oblique metadiaphyseal fracture has undergone internal fixation with a intramedullary rod with proximal distal interlocking screws as well as a lateral cortical plate with proximal cortical and distal transcondylar screws. Major fracture fragments demonstrate roughly 15 mm medial displacement and mild anterior angulation. No definite evidence of interval fracture. No osseous erosions. Circumscribed lytic periprosthetic lytic lesions within the posterior aspect of the femoral condyles may be postsurgical in nature or relate to stress shielding. Mild lateral subluxation the patella without frank dislocation. 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 Visualized proximal tibia and fibula are intact. Ligaments Suboptimally assessed  by CT. Muscles and Tendons Unremarkable. Soft tissues There is circumferential subcutaneous edema within the a distal left thigh. There is subcutaneous fluid subjacent to the lateral incision measuring 3.8 x 4.1 by roughly 23.7 cm in size possibly representing a postoperative seroma or hematoma. Super infection is not excluded on this examination. Small left knee effusion present. IMPRESSION: 1. Surgical changes of left total knee arthroplasty and internal fixation of a comminuted distal femoral metadiaphyseal fracture. No definite evidence of interval fracture. 2. Circumferential subcutaneous edema within the distal left thigh. Subcutaneous fluid subjacent to the lateral incision measuring 3.8 x 4.1 by roughly 23.7 cm in size possibly representing a postoperative seroma or hematoma. Super infection is not excluded on  this examination. 3. Small left knee effusion. Electronically Signed   By: Helyn Numbers M.D.   On: 02/04/2023 03:59    Medications Ordered in ED Medications  HYDROmorphone (DILAUDID) injection 0.5 mg (0.5 mg Intravenous Given 02/04/23 0439)  iohexol (OMNIPAQUE) 300 MG/ML solution 100 mL (100 mLs Intravenous Contrast Given 02/04/23 0314)  HYDROmorphone (DILAUDID) injection 0.5 mg (0.5 mg Intravenous Given 02/04/23 0507)   Procedures Procedures  (including critical care time) Medical Decision Making / ED Course   Medical Decision Making Amount and/or Complexity of Data Reviewed Labs: ordered. Decision-making details documented in ED Course. Radiology: ordered and independent interpretation performed. Decision-making details documented in ED Course.  Risk Prescription drug management. Parenteral controlled substances. Decision regarding hospitalization.    Patient is here with drainage from the large left lateral surgical incision.  Most likely serosanguineous drainage from mix seroma/hematoma.  There is a slight brownish tinge to the fluid that is but yet purulent.  She does have significant tenderness to palpation around 1.  Will obtain labs and imaging to rule out deep infection.  CBC with leukocytosis which appears to be close to her discharge CBC from 3 days ago.  Hemoglobin is improved. CMP without significant electrolyte derangements or renal sufficiency. CT of the femur notable for large fluid collection favoring seroma/hematoma but superimposed infection cannot be excluded.  Patient does not have another appointment with the surgeon until December 3 noted on her discharge summary. Consulted and spoke with Dr. Suella Broad PA, [facility] who review HIPAA compliant picture sent through text and approved by patient.  The APP remembers the patient stating that she was draining the same fluid just before discharge.  They believe this is likely softening hematoma.  He will  coordinate close follow-up with patient in the next several days for reevaluation and suture removal.    Final Clinical Impression(s) / ED Diagnoses Final diagnoses:  Draining postoperative wound, initial encounter  Hematoma   The patient appears reasonably screened and/or stabilized for discharge and I doubt any other medical condition or other [facility] requiring further screening, evaluation, or treatment in the ED at this time. I have discussed the findings, Dx and Tx plan with the patient/family who expressed understanding and agree(s) with the plan. Discharge instructions discussed at length. The patient/family was given strict return precautions who verbalized understanding of the instructions. No further questions at time of discharge.  Disposition: Discharge  Condition: Good  ED Discharge Orders     None        Follow Up: Diamantina Providence, MD [address] [place] [address] [phone]  Call on 02/06/2023 if you have not been called about your appointment before Monday     This chart was dictated using voice recognition software.  Despite  best efforts to proofread,  errors can occur which can change the documentation meaning.    Nira Conn, MD 02/04/23 [phone]

## 2023-02-04 NOTE — ED Triage Notes (Signed)
Pt BIB EMS for bleeding from surgical site of L femur fx repair. Surgery was Nov 10. Pt reports 10/10 pain to L thigh at surgical site. Blood noted to be oozing from bandages.

## 2023-12-23 ENCOUNTER — Other Ambulatory Visit (HOSPITAL_BASED_OUTPATIENT_CLINIC_OR_DEPARTMENT_OTHER): Payer: Self-pay | Admitting: Nurse Practitioner

## 2023-12-23 DIAGNOSIS — Z1231 Encounter for screening mammogram for malignant neoplasm of breast: Secondary | ICD-10-CM
